# Patient Record
Sex: Male | Born: 1975 | Race: Black or African American | Hispanic: No | Marital: Single | Smoking: Never smoker
Health system: Southern US, Community
[De-identification: ages and names within clinical notes are randomized; demographics above are authoritative.]

## PROBLEM LIST (undated history)

## (undated) DIAGNOSIS — I10 Essential (primary) hypertension: Secondary | ICD-10-CM

## (undated) DIAGNOSIS — G35 Multiple sclerosis: Secondary | ICD-10-CM

## (undated) HISTORY — DX: Multiple sclerosis: G35

## (undated) HISTORY — DX: Essential (primary) hypertension: I10

---

## 2013-01-08 DIAGNOSIS — R2681 Unsteadiness on feet: Secondary | ICD-10-CM | POA: Insufficient documentation

## 2015-02-16 ENCOUNTER — Ambulatory Visit: Payer: Medicare Other

## 2015-02-16 ENCOUNTER — Ambulatory Visit: Payer: Medicare Other | Admitting: Family Medicine

## 2015-02-20 ENCOUNTER — Inpatient Hospital Stay: Payer: Medicare Other

## 2015-02-20 ENCOUNTER — Encounter: Payer: Self-pay | Admitting: Oncology

## 2015-02-20 ENCOUNTER — Inpatient Hospital Stay: Payer: Medicare Other | Attending: Oncology | Admitting: Oncology

## 2015-02-20 VITALS — BP 102/67 | HR 74 | Temp 98.0°F | Resp 18

## 2015-02-20 VITALS — BP 108/71 | HR 92 | Temp 98.1°F | Resp 18 | Ht 72.44 in | Wt 194.2 lb

## 2015-02-20 DIAGNOSIS — G35 Multiple sclerosis: Secondary | ICD-10-CM | POA: Diagnosis not present

## 2015-02-20 DIAGNOSIS — Z79899 Other long term (current) drug therapy: Secondary | ICD-10-CM | POA: Insufficient documentation

## 2015-02-20 DIAGNOSIS — I10 Essential (primary) hypertension: Secondary | ICD-10-CM | POA: Insufficient documentation

## 2015-02-20 MED ORDER — SODIUM CHLORIDE 0.9 % IV SOLN
Freq: Once | INTRAVENOUS | Status: AC
  Administered 2015-02-20: 10:00:00 via INTRAVENOUS
  Filled 2015-02-20: qty 1000

## 2015-02-20 MED ORDER — ACETAMINOPHEN 500 MG PO TABS
1000.0000 mg | ORAL_TABLET | Freq: Once | ORAL | Status: AC
  Administered 2015-02-20: 1000 mg via ORAL
  Filled 2015-02-20: qty 2

## 2015-02-20 MED ORDER — SODIUM CHLORIDE 0.9 % IV SOLN
300.0000 mg | Freq: Once | INTRAVENOUS | Status: AC
  Administered 2015-02-20: 300 mg via INTRAVENOUS
  Filled 2015-02-20: qty 15

## 2015-02-20 NOTE — Progress Notes (Signed)
[facility]  Telephone:(336) [phone] Fax:(336) [phone]  ID: Janalee Dane OB: Jul 01, 1976  MR#: [id number]  AOZ#:[id number]  No care team member to display  CHIEF COMPLAINT:  Chief Complaint  Patient presents with  . New Evaluation    Tysabri infusions for MS    INTERVAL HISTORY: Patient is a 39 year old male with a diagnosis of multiple sclerosis referred to receive Tysabri infusions. He has been receiving these infusions for over a year without significant side effects or reaction. He reports his JC virus is negative. He currently feels well and is asymptomatic. He denies any recent fevers or illnesses. He has no new neurologic complaints. He has a good appetite and denies weight loss. Patient feels at his baseline and offers no specific complaints today.  REVIEW OF SYSTEMS:   Review of Systems  Constitutional: Negative.   Respiratory: Negative.   Cardiovascular: Negative.     As per HPI. Otherwise, a complete review of systems is negatve.  PAST MEDICAL HISTORY: Multiple sclerosis, hypertension.  PAST SURGICAL HISTORY: No past surgical history on file.  FAMILY HISTORY: Reviewed and unchanged. No report of chronic disease.      ADVANCED DIRECTIVES:    HEALTH MAINTENANCE: History  Substance Use Topics  . Smoking status: Not on file  . Smokeless tobacco: Not on file  . Alcohol Use: Not on file     Colonoscopy:  PAP:  Bone density:  Lipid panel:  No Known Allergies  Current Outpatient Prescriptions  Medication Sig Dispense Refill  . dalfampridine (AMPYRA) 10 MG TB12 Take 10 mg by mouth 2 (two) times daily.    . ferrous fumarate (HEMOCYTE - 106 MG FE) 325 (106 FE) MG TABS tablet Take 1 tablet by mouth.    Marland Kitchen lisinopril-hydrochlorothiazide (PRINZIDE,ZESTORETIC) 20-25 MG per tablet Take 1 tablet by mouth daily.    . Multiple Vitamin (MULTIVITAMIN) tablet Take 1 tablet by mouth daily.    . Omega-3 Fatty Acids (FISH OIL) 1000 MG CAPS Take 1  capsule by mouth daily.     No current facility-administered medications for this visit.    OBJECTIVE: There were no vitals filed for this visit.   There is no height or weight on file to calculate BMI.    ECOG FS:0 - Asymptomatic  General: Well-developed, well-nourished, no acute distress. Eyes: Pink conjunctiva, anicteric sclera. HEENT: Normocephalic, moist mucous membranes, clear oropharnyx. Lungs: Clear to auscultation bilaterally. Heart: Regular rate and rhythm. No rubs, murmurs, or gallops. Abdomen: Soft, nontender, nondistended. No organomegaly noted, normoactive bowel sounds. Musculoskeletal: No edema, cyanosis, or clubbing. Neuro: Alert, answering all questions appropriately. Cranial nerves grossly intact. Skin: No rashes or petechiae noted. Psych: Normal affect. Lymphatics: No cervical, calvicular, axillary or inguinal LAD.   LAB RESULTS:  No results found for: NA, K, CL, CO2, GLUCOSE, BUN, CREATININE, CALCIUM, PROT, ALBUMIN, AST, ALT, ALKPHOS, BILITOT, GFRNONAA, GFRAA  No results found for: WBC, NEUTROABS, HGB, HCT, MCV, PLT   STUDIES: No results found.  ASSESSMENT:  Tysabri infusion for multiple sclerosis.   PLAN:    1. Multiple sclerosis: Continue with 300 mg to Tysabri every 4 weeks as ordered by patient's primary neurologist. Patient expressed understanding that all laboratory work including his JC virus titers, will be checked by his Neurologist. He also understands that any questions or concerns regarding Tysabri or his multiple sclerosis should be directed towards his Neurologist. Return to clinic in 6 months for routine evaluation.  Patient expressed understanding and was in agreement with this plan. He also  understands that He can call clinic at any time with any questions, concerns, or complaints.   Neurologist at [facility]:  Satira Sark, MD  Jeralyn Ruths, MD   02/20/2015 9:48 AM

## 2015-03-20 ENCOUNTER — Inpatient Hospital Stay: Payer: Medicare Other | Attending: Oncology

## 2015-03-20 VITALS — BP 108/69 | HR 79 | Temp 98.8°F | Resp 18

## 2015-03-20 DIAGNOSIS — G35 Multiple sclerosis: Secondary | ICD-10-CM | POA: Diagnosis present

## 2015-03-20 DIAGNOSIS — Z79899 Other long term (current) drug therapy: Secondary | ICD-10-CM | POA: Insufficient documentation

## 2015-03-20 MED ORDER — SODIUM CHLORIDE 0.9 % IJ SOLN
3.0000 mL | Freq: Once | INTRAMUSCULAR | Status: DC
  Filled 2015-03-20: qty 10

## 2015-03-20 MED ORDER — SODIUM CHLORIDE 0.9 % IV SOLN
Freq: Once | INTRAVENOUS | Status: AC
  Administered 2015-03-20: 11:00:00 via INTRAVENOUS
  Filled 2015-03-20: qty 1000

## 2015-03-20 MED ORDER — SODIUM CHLORIDE 0.9 % IV SOLN
300.0000 mg | Freq: Once | INTRAVENOUS | Status: AC
  Administered 2015-03-20: 300 mg via INTRAVENOUS
  Filled 2015-03-20: qty 15

## 2015-03-20 MED ORDER — ACETAMINOPHEN 500 MG PO TABS
1000.0000 mg | ORAL_TABLET | Freq: Once | ORAL | Status: AC
  Administered 2015-03-20: 1000 mg via ORAL
  Filled 2015-03-20: qty 2

## 2015-03-20 MED ORDER — SODIUM CHLORIDE 0.9 % IJ SOLN
10.0000 mL | Freq: Once | INTRAMUSCULAR | Status: DC
  Filled 2015-03-20: qty 10

## 2015-03-25 DIAGNOSIS — I10 Essential (primary) hypertension: Secondary | ICD-10-CM | POA: Insufficient documentation

## 2015-04-17 ENCOUNTER — Inpatient Hospital Stay: Payer: Medicare Other | Attending: Oncology

## 2015-04-17 ENCOUNTER — Telehealth: Payer: Self-pay | Admitting: Pharmacist

## 2015-04-17 VITALS — BP 107/68 | HR 78 | Temp 96.8°F | Resp 20

## 2015-04-17 DIAGNOSIS — G35 Multiple sclerosis: Secondary | ICD-10-CM | POA: Diagnosis present

## 2015-04-17 DIAGNOSIS — Z79899 Other long term (current) drug therapy: Secondary | ICD-10-CM | POA: Insufficient documentation

## 2015-04-17 MED ORDER — SODIUM CHLORIDE 0.9 % IV SOLN
Freq: Once | INTRAVENOUS | Status: AC
  Administered 2015-04-17: 10:00:00 via INTRAVENOUS
  Filled 2015-04-17: qty 1000

## 2015-04-17 MED ORDER — ACETAMINOPHEN 500 MG PO TABS
1000.0000 mg | ORAL_TABLET | Freq: Once | ORAL | Status: AC
Start: 2015-04-17 — End: 2015-04-17
  Administered 2015-04-17: 1000 mg via ORAL
  Filled 2015-04-17: qty 2

## 2015-04-17 MED ORDER — SODIUM CHLORIDE 0.9 % IV SOLN
300.0000 mg | Freq: Once | INTRAVENOUS | Status: AC
  Administered 2015-04-17: 300 mg via INTRAVENOUS
  Filled 2015-04-17: qty 15

## 2015-04-17 NOTE — Telephone Encounter (Signed)
CalledDr. [name]'s office at [facility] [phone]) to request a new refill. I also had patient reach out to them. They will not send prescriptions in a timely fashion. Told them we need a written prescription with refills. Still no response from 9:00 phone call by me. Sending fax requesting information ASAP.

## 2015-04-24 ENCOUNTER — Telehealth: Payer: Self-pay | Admitting: Pharmacist

## 2015-04-24 NOTE — Telephone Encounter (Signed)
Left message with infusion at [facility] [phone]). We still have not received a prescription for patient's Tysabri infusion from 04/17/15. We did receive an infusion order dated 05/02/15 for his October infusion. Gave them fax# for [place].

## 2015-05-15 ENCOUNTER — Inpatient Hospital Stay: Payer: Medicare Other | Attending: Oncology

## 2015-05-15 VITALS — BP 109/79 | HR 80 | Temp 97.7°F | Resp 18

## 2015-05-15 DIAGNOSIS — Z79899 Other long term (current) drug therapy: Secondary | ICD-10-CM | POA: Diagnosis not present

## 2015-05-15 DIAGNOSIS — G35 Multiple sclerosis: Secondary | ICD-10-CM | POA: Diagnosis present

## 2015-05-15 MED ORDER — SODIUM CHLORIDE 0.9 % IV SOLN
Freq: Once | INTRAVENOUS | Status: AC
  Administered 2015-05-15: 11:00:00 via INTRAVENOUS
  Filled 2015-05-15: qty 1000

## 2015-05-15 MED ORDER — SODIUM CHLORIDE 0.9 % IV SOLN
300.0000 mg | Freq: Once | INTRAVENOUS | Status: AC
  Administered 2015-05-15: 300 mg via INTRAVENOUS
  Filled 2015-05-15: qty 15

## 2015-05-15 MED ORDER — ACETAMINOPHEN 500 MG PO TABS
1000.0000 mg | ORAL_TABLET | Freq: Once | ORAL | Status: AC
  Administered 2015-05-15: 1000 mg via ORAL
  Filled 2015-05-15: qty 2

## 2015-06-11 ENCOUNTER — Telehealth: Payer: Self-pay | Admitting: Pharmacist

## 2015-06-11 NOTE — Telephone Encounter (Signed)
Called for Tysabri refill on 06/10/15 and again today 06/11/15. [phone])  Will call patient and let them know that if we dont have refill prescription by this afternoon his appointment will have to be rescheduled

## 2015-06-12 ENCOUNTER — Inpatient Hospital Stay: Payer: Medicare Other | Attending: Oncology

## 2015-06-12 VITALS — BP 112/73 | HR 86 | Temp 97.5°F | Resp 18

## 2015-06-12 DIAGNOSIS — Z79899 Other long term (current) drug therapy: Secondary | ICD-10-CM | POA: Insufficient documentation

## 2015-06-12 DIAGNOSIS — G35 Multiple sclerosis: Secondary | ICD-10-CM | POA: Diagnosis not present

## 2015-06-12 MED ORDER — SODIUM CHLORIDE 0.9 % IV SOLN
Freq: Once | INTRAVENOUS | Status: AC
  Administered 2015-06-12: 10:00:00 via INTRAVENOUS
  Filled 2015-06-12: qty 1000

## 2015-06-12 MED ORDER — ACETAMINOPHEN 500 MG PO TABS
1000.0000 mg | ORAL_TABLET | Freq: Once | ORAL | Status: AC
  Administered 2015-06-12: 1000 mg via ORAL
  Filled 2015-06-12: qty 2

## 2015-06-12 MED ORDER — SODIUM CHLORIDE 0.9 % IV SOLN
300.0000 mg | Freq: Once | INTRAVENOUS | Status: AC
  Administered 2015-06-12: 300 mg via INTRAVENOUS
  Filled 2015-06-12: qty 15

## 2015-07-10 ENCOUNTER — Inpatient Hospital Stay: Payer: Medicare Other | Attending: Oncology

## 2015-07-10 VITALS — BP 105/69 | HR 84 | Temp 97.2°F | Resp 18

## 2015-07-10 DIAGNOSIS — G35 Multiple sclerosis: Secondary | ICD-10-CM | POA: Diagnosis present

## 2015-07-10 DIAGNOSIS — Z79899 Other long term (current) drug therapy: Secondary | ICD-10-CM | POA: Insufficient documentation

## 2015-07-10 MED ORDER — ACETAMINOPHEN 500 MG PO TABS
1000.0000 mg | ORAL_TABLET | Freq: Once | ORAL | Status: AC
Start: 2015-07-10 — End: 2015-07-10
  Administered 2015-07-10: 1000 mg via ORAL
  Filled 2015-07-10: qty 2

## 2015-07-10 MED ORDER — SODIUM CHLORIDE 0.9 % IV SOLN
300.0000 mg | Freq: Once | INTRAVENOUS | Status: AC
  Administered 2015-07-10: 300 mg via INTRAVENOUS
  Filled 2015-07-10: qty 15

## 2015-07-10 MED ORDER — SODIUM CHLORIDE 0.9 % IV SOLN
Freq: Once | INTRAVENOUS | Status: AC
  Administered 2015-07-10: 10:00:00 via INTRAVENOUS
  Filled 2015-07-10: qty 1000

## 2015-08-06 ENCOUNTER — Other Ambulatory Visit: Payer: Self-pay | Admitting: *Deleted

## 2015-08-06 DIAGNOSIS — G35 Multiple sclerosis: Secondary | ICD-10-CM

## 2015-08-07 ENCOUNTER — Inpatient Hospital Stay: Payer: Medicare Other

## 2015-08-07 ENCOUNTER — Encounter: Payer: Self-pay | Admitting: *Deleted

## 2015-08-07 ENCOUNTER — Other Ambulatory Visit: Payer: Self-pay | Admitting: *Deleted

## 2015-08-07 ENCOUNTER — Inpatient Hospital Stay: Payer: Medicare Other | Attending: Oncology

## 2015-08-07 VITALS — BP 117/69 | HR 88 | Temp 98.0°F | Resp 18

## 2015-08-07 DIAGNOSIS — G35 Multiple sclerosis: Secondary | ICD-10-CM | POA: Diagnosis not present

## 2015-08-07 DIAGNOSIS — Z79899 Other long term (current) drug therapy: Secondary | ICD-10-CM | POA: Diagnosis not present

## 2015-08-07 LAB — COMPREHENSIVE METABOLIC PANEL
ALK PHOS: 102 U/L (ref 38–126)
ALT: 22 U/L (ref 17–63)
AST: 19 U/L (ref 15–41)
Albumin: 4.2 g/dL (ref 3.5–5.0)
Anion gap: 6 (ref 5–15)
BILIRUBIN TOTAL: 0.7 mg/dL (ref 0.3–1.2)
BUN: 20 mg/dL (ref 6–20)
CALCIUM: 9.2 mg/dL (ref 8.9–10.3)
CO2: 28 mmol/L (ref 22–32)
CREATININE: 0.98 mg/dL (ref 0.61–1.24)
Chloride: 100 mmol/L — ABNORMAL LOW (ref 101–111)
GFR calc non Af Amer: >60 mL/min (ref >60)
GLUCOSE: 113 mg/dL — AB (ref 65–99)
Potassium: 3.7 mmol/L (ref 3.5–5.1)
SODIUM: 134 mmol/L — AB (ref 135–145)
TOTAL PROTEIN: 7.6 g/dL (ref 6.5–8.1)

## 2015-08-07 LAB — CBC WITH DIFFERENTIAL/PLATELET
Basophils Absolute: 0 10*3/uL (ref 0–0.1)
Basophils Relative: 0 %
EOS ABS: 0.1 10*3/uL (ref 0–0.7)
Eosinophils Relative: 2 %
HEMATOCRIT: 41.4 % (ref 40.0–52.0)
HEMOGLOBIN: 14.1 g/dL (ref 13.0–18.0)
LYMPHS ABS: 3 10*3/uL (ref 1.0–3.6)
LYMPHS PCT: 39 %
MCH: 32.9 pg (ref 26.0–34.0)
MCHC: 34.1 g/dL (ref 32.0–36.0)
MCV: 96.5 fL (ref 80.0–100.0)
MONOS PCT: 12 %
Monocytes Absolute: 0.9 10*3/uL (ref 0.2–1.0)
NEUTROS ABS: 3.7 10*3/uL (ref 1.4–6.5)
NEUTROS PCT: 47 %
Platelets: 250 10*3/uL (ref 150–440)
RBC: 4.29 MIL/uL — AB (ref 4.40–5.90)
RDW: 13.2 % (ref 11.5–14.5)
WBC: 7.7 10*3/uL (ref 3.8–10.6)

## 2015-08-07 MED ORDER — SODIUM CHLORIDE 0.9 % IV SOLN
Freq: Once | INTRAVENOUS | Status: AC
Start: 2015-08-07 — End: 2015-08-07
  Administered 2015-08-07: 10:00:00 via INTRAVENOUS
  Filled 2015-08-07: qty 1000

## 2015-08-07 MED ORDER — ACETAMINOPHEN 500 MG PO TABS
1000.0000 mg | ORAL_TABLET | Freq: Once | ORAL | Status: AC
  Administered 2015-08-07: 1000 mg via ORAL
  Filled 2015-08-07: qty 2

## 2015-08-07 MED ORDER — SODIUM CHLORIDE 0.9 % IV SOLN
300.0000 mg | Freq: Once | INTRAVENOUS | Status: AC
  Administered 2015-08-07: 300 mg via INTRAVENOUS
  Filled 2015-08-07: qty 15

## 2015-08-07 NOTE — Progress Notes (Signed)
Per orders from Dr. Satira Sark ([facility] in [place];Neurology) patient to have cbc and metc drawn prior to each Tysabri infusion. Please fax results to [phone].

## 2015-09-04 ENCOUNTER — Inpatient Hospital Stay (HOSPITAL_BASED_OUTPATIENT_CLINIC_OR_DEPARTMENT_OTHER): Payer: Medicare Other | Admitting: Oncology

## 2015-09-04 ENCOUNTER — Inpatient Hospital Stay: Payer: Medicare Other | Attending: Oncology

## 2015-09-04 ENCOUNTER — Inpatient Hospital Stay: Payer: Medicare Other

## 2015-09-04 VITALS — BP 116/84 | HR 78 | Resp 20

## 2015-09-04 DIAGNOSIS — Z79899 Other long term (current) drug therapy: Secondary | ICD-10-CM | POA: Diagnosis not present

## 2015-09-04 DIAGNOSIS — I10 Essential (primary) hypertension: Secondary | ICD-10-CM | POA: Insufficient documentation

## 2015-09-04 DIAGNOSIS — G35 Multiple sclerosis: Secondary | ICD-10-CM | POA: Insufficient documentation

## 2015-09-04 LAB — CBC WITH DIFFERENTIAL/PLATELET
BASOS ABS: 0.1 10*3/uL (ref 0–0.1)
BASOS PCT: 1 %
EOS ABS: 0.1 10*3/uL (ref 0–0.7)
Eosinophils Relative: 2 %
HCT: 42.6 % (ref 40.0–52.0)
HEMOGLOBIN: 14.5 g/dL (ref 13.0–18.0)
Lymphocytes Relative: 38 %
Lymphs Abs: 2.5 10*3/uL (ref 1.0–3.6)
MCH: 32.6 pg (ref 26.0–34.0)
MCHC: 34.2 g/dL (ref 32.0–36.0)
MCV: 95.3 fL (ref 80.0–100.0)
MONOS PCT: 13 %
Monocytes Absolute: 0.8 10*3/uL (ref 0.2–1.0)
NEUTROS PCT: 46 %
Neutro Abs: 3.1 10*3/uL (ref 1.4–6.5)
Platelets: 240 10*3/uL (ref 150–440)
RBC: 4.47 MIL/uL (ref 4.40–5.90)
RDW: 13.3 % (ref 11.5–14.5)
WBC: 6.7 10*3/uL (ref 3.8–10.6)

## 2015-09-04 LAB — COMPREHENSIVE METABOLIC PANEL
ALBUMIN: 4.5 g/dL (ref 3.5–5.0)
ALK PHOS: 97 U/L (ref 38–126)
ALT: 22 U/L (ref 17–63)
AST: 19 U/L (ref 15–41)
Anion gap: 10 (ref 5–15)
BILIRUBIN TOTAL: 0.9 mg/dL (ref 0.3–1.2)
BUN: 18 mg/dL (ref 6–20)
CALCIUM: 9.6 mg/dL (ref 8.9–10.3)
CO2: 26 mmol/L (ref 22–32)
CREATININE: 0.93 mg/dL (ref 0.61–1.24)
Chloride: 99 mmol/L — ABNORMAL LOW (ref 101–111)
GFR calc Af Amer: >60 mL/min (ref >60)
GFR calc non Af Amer: >60 mL/min (ref >60)
Glucose, Bld: 118 mg/dL — ABNORMAL HIGH (ref 65–99)
Potassium: 3.9 mmol/L (ref 3.5–5.1)
Sodium: 135 mmol/L (ref 135–145)
TOTAL PROTEIN: 8 g/dL (ref 6.5–8.1)

## 2015-09-04 MED ORDER — SODIUM CHLORIDE 0.9 % IJ SOLN
10.0000 mL | Freq: Once | INTRAMUSCULAR | Status: DC
  Filled 2015-09-04: qty 10

## 2015-09-04 MED ORDER — ALTEPLASE 2 MG IJ SOLR
2.0000 mg | Freq: Once | INTRAMUSCULAR | Status: DC
  Filled 2015-09-04: qty 2

## 2015-09-04 MED ORDER — HEPARIN SOD (PORK) LOCK FLUSH 100 UNIT/ML IV SOLN: 250.0000 [IU] | Freq: Once | INTRAVENOUS | Status: DC

## 2015-09-04 MED ORDER — ACETAMINOPHEN 500 MG PO TABS
1000.0000 mg | ORAL_TABLET | Freq: Once | ORAL | Status: AC
  Administered 2015-09-04: 1000 mg via ORAL
  Filled 2015-09-04: qty 2

## 2015-09-04 MED ORDER — HEPARIN SOD (PORK) LOCK FLUSH 100 UNIT/ML IV SOLN: 500.0000 [IU] | Freq: Once | INTRAVENOUS | Status: DC

## 2015-09-04 MED ORDER — SODIUM CHLORIDE 0.9 % IV SOLN
Freq: Once | INTRAVENOUS | Status: AC
  Administered 2015-09-04: 11:00:00 via INTRAVENOUS
  Filled 2015-09-04: qty 1000

## 2015-09-04 MED ORDER — NATALIZUMAB 300 MG/15ML IV CONC
300.0000 mg | Freq: Once | INTRAVENOUS | Status: AC
  Administered 2015-09-04: 300 mg via INTRAVENOUS
  Filled 2015-09-04: qty 15

## 2015-09-04 MED ORDER — SODIUM CHLORIDE 0.9 % IJ SOLN
3.0000 mL | Freq: Once | INTRAMUSCULAR | Status: DC
Start: 2015-09-04 — End: 2015-09-04
  Filled 2015-09-04: qty 10

## 2015-09-04 NOTE — Progress Notes (Signed)
[facility]  Telephone:(336) [phone] Fax:(336) [phone]  ID: Jerry Bautista OB: [birth year]  MR#: [id number]  NUU#:[id number]  Dr. Meredeth Ide with [facility], [place], [place]  CHIEF COMPLAINT:  MS  INTERVAL HISTORY: Patient is a 40 year old male with a diagnosis of multiple sclerosis referred to receive Tysabri infusions. He has been receiving these infusions for over a year without significant side effects or reaction. He reports his JC virus is negative. He currently feels well and is asymptomatic. He denies any recent fevers or illnesses. He has no new neurologic complaints. He has a good appetite and denies weight loss. Patient was evaluated by Dr. Meredeth Ide approximately 1-2 months ago. Patient feels at his baseline and offers no specific complaints today.  REVIEW OF SYSTEMS:   Review of Systems  Constitutional: Negative.  Negative for fever, chills, weight loss, malaise/fatigue and diaphoresis.  HENT: Negative.   Eyes: Negative.   Respiratory: Negative.  Negative for cough, hemoptysis, sputum production, shortness of breath and wheezing.   Cardiovascular: Negative.  Negative for chest pain, palpitations, orthopnea, claudication, leg swelling and PND.  Gastrointestinal: Negative for heartburn, nausea, vomiting, abdominal pain, diarrhea, constipation, blood in stool and melena.  Genitourinary: Negative.   Musculoskeletal: Negative.   Skin: Negative.   Neurological: Negative for dizziness, tingling, focal weakness, seizures and weakness.  Endo/Heme/Allergies: Does not bruise/bleed easily.  Psychiatric/Behavioral: Negative for depression. The patient is not nervous/anxious and does not have insomnia.     As per HPI. Otherwise, a complete review of systems is negatve.  PAST MEDICAL HISTORY: Multiple sclerosis, hypertension.  PAST SURGICAL HISTORY: No past surgical history on file.  FAMILY HISTORY: Reviewed and unchanged. No report of chronic disease.       ADVANCED DIRECTIVES:    HEALTH MAINTENANCE: Social History  Substance Use Topics  . Smoking status: Not on file  . Smokeless tobacco: Not on file  . Alcohol Use: Not on file     Colonoscopy:  PAP:  Bone density:  Lipid panel:  No Known Allergies  Current Outpatient Prescriptions  Medication Sig Dispense Refill  . dalfampridine (AMPYRA) 10 MG TB12 Take 10 mg by mouth 2 (two) times daily.    . ferrous fumarate (HEMOCYTE - 106 MG FE) 325 (106 FE) MG TABS tablet Take 1 tablet by mouth.    Marland Kitchen lisinopril-hydrochlorothiazide (PRINZIDE,ZESTORETIC) 20-25 MG per tablet Take 1 tablet by mouth daily.    . Multiple Vitamin (MULTIVITAMIN) tablet Take 1 tablet by mouth daily.    . Omega-3 Fatty Acids (FISH OIL) 1000 MG CAPS Take 1 capsule by mouth daily.     No current facility-administered medications for this visit.   Facility-Administered Medications Ordered in Other Visits  Medication Dose Route Frequency Provider Last Rate Last Dose  . alteplase (CATHFLO ACTIVASE) injection 2 mg  2 mg Intracatheter Once Jeralyn Ruths, MD   2 mg at 09/04/15 1055  . heparin lock flush 100 unit/mL  500 Units Intracatheter Once Jeralyn Ruths, MD   500 Units at 09/04/15 1055  . heparin lock flush 100 unit/mL  250 Units Intracatheter Once Jeralyn Ruths, MD   250 Units at 09/04/15 1055  . natalizumab (TYSABRI) 300 mg in sodium chloride 0.9 % 100 mL IVPB  300 mg Intravenous Once Jeralyn Ruths, MD 115 mL/hr at 09/04/15 1108 300 mg at 09/04/15 1108  . sodium chloride 0.9 % injection 10 mL  10 mL Intracatheter Once Jeralyn Ruths, MD   10 mL at 09/04/15 1054  .  sodium chloride 0.9 % injection 3 mL  3 mL Intracatheter Once Jeralyn Ruths, MD   3 mL at 09/04/15 1055    OBJECTIVE: ECOG FS:0 - Asymptomatic  General: Well-developed, well-nourished, no acute distress. Eyes: Pink conjunctiva, anicteric sclera. HEENT: Normocephalic, moist mucous membranes, clear oropharnyx. Lungs:  Clear to auscultation bilaterally. Heart: Regular rate and rhythm. No rubs, murmurs, or gallops. Abdomen: Soft, nontender, nondistended. No organomegaly noted, normoactive bowel sounds. Musculoskeletal: No edema, cyanosis, or clubbing. Neuro: Alert, answering all questions appropriately. Cranial nerves grossly intact. Skin: No rashes or petechiae noted. Psych: Normal affect.    LAB RESULTS:  Lab Results  Component Value Date   NA 134* 08/07/2015   K 3.7 08/07/2015   CL 100* 08/07/2015   CO2 28 08/07/2015   GLUCOSE 113* 08/07/2015   BUN 20 08/07/2015   CREATININE 0.98 08/07/2015   CALCIUM 9.2 08/07/2015   PROT 7.6 08/07/2015   ALBUMIN 4.2 08/07/2015   AST 19 08/07/2015   ALT 22 08/07/2015   ALKPHOS 102 08/07/2015   BILITOT 0.7 08/07/2015   GFRNONAA >60 08/07/2015   GFRAA >60 08/07/2015    Lab Results  Component Value Date   WBC 6.7 09/04/2015   NEUTROABS 3.1 09/04/2015   HGB 14.5 09/04/2015   HCT 42.6 09/04/2015   MCV 95.3 09/04/2015   PLT 240 09/04/2015     STUDIES: No results found.  ASSESSMENT:  Tysabri infusion for multiple sclerosis.   PLAN:    1. Multiple sclerosis: Continue with 300 mg to Tysabri every 4 weeks as ordered by patient's primary neurologist. Patient expressed understanding that all laboratory work including his JC virus titers, will be checked by his Neurologist.   He also understands that any questions or concerns regarding Tysabri or his multiple sclerosis should be directed towards his Neurologist. Return to clinic in 6 months for routine evaluation.  Patient expressed understanding and was in agreement with this plan. He also understands that He can call clinic at any time with any questions, concerns, or complaints.   Neurologist at [facility]:  Satira Sark, MD  Loann Quill, NP   09/04/2015 11:09 AM

## 2015-10-02 ENCOUNTER — Inpatient Hospital Stay: Payer: Medicare Other | Attending: Oncology

## 2015-10-02 ENCOUNTER — Inpatient Hospital Stay: Payer: Medicare Other

## 2015-10-02 VITALS — BP 110/73 | HR 78 | Temp 98.8°F | Resp 18

## 2015-10-02 DIAGNOSIS — G35 Multiple sclerosis: Secondary | ICD-10-CM

## 2015-10-02 DIAGNOSIS — Z79899 Other long term (current) drug therapy: Secondary | ICD-10-CM | POA: Diagnosis not present

## 2015-10-02 LAB — COMPREHENSIVE METABOLIC PANEL
ALBUMIN: 4.4 g/dL (ref 3.5–5.0)
ALK PHOS: 102 U/L (ref 38–126)
ALT: 23 U/L (ref 17–63)
ANION GAP: 6 (ref 5–15)
AST: 18 U/L (ref 15–41)
BUN: 20 mg/dL (ref 6–20)
CALCIUM: 9 mg/dL (ref 8.9–10.3)
CHLORIDE: 101 mmol/L (ref 101–111)
CO2: 26 mmol/L (ref 22–32)
Creatinine, Ser: 0.83 mg/dL (ref 0.61–1.24)
GFR calc non Af Amer: >60 mL/min (ref >60)
GLUCOSE: 112 mg/dL — AB (ref 65–99)
POTASSIUM: 4 mmol/L (ref 3.5–5.1)
SODIUM: 133 mmol/L — AB (ref 135–145)
Total Bilirubin: 0.7 mg/dL (ref 0.3–1.2)
Total Protein: 7.9 g/dL (ref 6.5–8.1)

## 2015-10-02 LAB — CBC WITH DIFFERENTIAL/PLATELET
BASOS PCT: 1 %
Basophils Absolute: 0.1 10*3/uL (ref 0–0.1)
Eosinophils Absolute: 0.2 10*3/uL (ref 0–0.7)
Eosinophils Relative: 2 %
HEMATOCRIT: 41.7 % (ref 40.0–52.0)
HEMOGLOBIN: 14.1 g/dL (ref 13.0–18.0)
LYMPHS ABS: 2.9 10*3/uL (ref 1.0–3.6)
LYMPHS PCT: 34 %
MCH: 32.3 pg (ref 26.0–34.0)
MCHC: 34 g/dL (ref 32.0–36.0)
MCV: 95.1 fL (ref 80.0–100.0)
MONO ABS: 1.3 10*3/uL — AB (ref 0.2–1.0)
MONOS PCT: 15 %
NEUTROS ABS: 4.1 10*3/uL (ref 1.4–6.5)
NEUTROS PCT: 48 %
Platelets: 230 10*3/uL (ref 150–440)
RBC: 4.38 MIL/uL — ABNORMAL LOW (ref 4.40–5.90)
RDW: 12.7 % (ref 11.5–14.5)
WBC: 8.5 10*3/uL (ref 3.8–10.6)

## 2015-10-02 MED ORDER — SODIUM CHLORIDE 0.9 % IV SOLN
Freq: Once | INTRAVENOUS | Status: AC
  Administered 2015-10-02: 10:00:00 via INTRAVENOUS
  Filled 2015-10-02: qty 1000

## 2015-10-02 MED ORDER — SODIUM CHLORIDE 0.9 % IV SOLN
300.0000 mg | Freq: Once | INTRAVENOUS | Status: AC
  Administered 2015-10-02: 300 mg via INTRAVENOUS
  Filled 2015-10-02: qty 15

## 2015-10-02 MED ORDER — ACETAMINOPHEN 500 MG PO TABS
1000.0000 mg | ORAL_TABLET | Freq: Once | ORAL | Status: AC
  Administered 2015-10-02: 1000 mg via ORAL
  Filled 2015-10-02: qty 2

## 2015-10-30 ENCOUNTER — Inpatient Hospital Stay: Payer: Medicare Other

## 2015-10-30 VITALS — BP 107/75 | HR 76 | Temp 96.9°F

## 2015-10-30 DIAGNOSIS — G35 Multiple sclerosis: Secondary | ICD-10-CM

## 2015-10-30 LAB — COMPREHENSIVE METABOLIC PANEL
ALBUMIN: 4.3 g/dL (ref 3.5–5.0)
ALT: 31 U/L (ref 17–63)
AST: 24 U/L (ref 15–41)
Alkaline Phosphatase: 116 U/L (ref 38–126)
Anion gap: 4 — ABNORMAL LOW (ref 5–15)
BUN: 21 mg/dL — AB (ref 6–20)
CO2: 26 mmol/L (ref 22–32)
Calcium: 9.2 mg/dL (ref 8.9–10.3)
Chloride: 104 mmol/L (ref 101–111)
Creatinine, Ser: 0.95 mg/dL (ref 0.61–1.24)
GFR calc Af Amer: >60 mL/min (ref >60)
Glucose, Bld: 114 mg/dL — ABNORMAL HIGH (ref 65–99)
POTASSIUM: 4.2 mmol/L (ref 3.5–5.1)
Sodium: 134 mmol/L — ABNORMAL LOW (ref 135–145)
Total Bilirubin: 0.7 mg/dL (ref 0.3–1.2)
Total Protein: 8.1 g/dL (ref 6.5–8.1)

## 2015-10-30 LAB — CBC WITH DIFFERENTIAL/PLATELET
Basophils Absolute: 0.1 10*3/uL (ref 0–0.1)
Basophils Relative: 1 %
EOS PCT: 2 %
Eosinophils Absolute: 0.1 10*3/uL (ref 0–0.7)
HCT: 41.6 % (ref 40.0–52.0)
HEMOGLOBIN: 14.2 g/dL (ref 13.0–18.0)
LYMPHS ABS: 2.9 10*3/uL (ref 1.0–3.6)
LYMPHS PCT: 33 %
MCH: 32.3 pg (ref 26.0–34.0)
MCHC: 34.2 g/dL (ref 32.0–36.0)
MCV: 94.6 fL (ref 80.0–100.0)
Monocytes Absolute: 1.2 10*3/uL — ABNORMAL HIGH (ref 0.2–1.0)
Monocytes Relative: 14 %
Neutro Abs: 4.4 10*3/uL (ref 1.4–6.5)
Neutrophils Relative %: 50 %
PLATELETS: 286 10*3/uL (ref 150–440)
RBC: 4.39 MIL/uL — AB (ref 4.40–5.90)
RDW: 13.4 % (ref 11.5–14.5)
WBC: 8.8 10*3/uL (ref 3.8–10.6)

## 2015-10-30 MED ORDER — ACETAMINOPHEN 500 MG PO TABS
1000.0000 mg | ORAL_TABLET | Freq: Once | ORAL | Status: AC
  Administered 2015-10-30: 1000 mg via ORAL
  Filled 2015-10-30: qty 2

## 2015-10-30 MED ORDER — SODIUM CHLORIDE 0.9 % IV SOLN
Freq: Once | INTRAVENOUS | Status: AC
  Administered 2015-10-30: 11:00:00 via INTRAVENOUS
  Filled 2015-10-30: qty 1000

## 2015-10-30 MED ORDER — SODIUM CHLORIDE 0.9 % IV SOLN
300.0000 mg | Freq: Once | INTRAVENOUS | Status: AC
  Administered 2015-10-30: 300 mg via INTRAVENOUS
  Filled 2015-10-30: qty 15

## 2015-11-27 ENCOUNTER — Inpatient Hospital Stay: Payer: Medicare Other | Attending: Oncology

## 2015-11-27 ENCOUNTER — Inpatient Hospital Stay: Payer: Medicare Other

## 2015-11-27 VITALS — BP 103/77 | HR 77 | Temp 96.3°F | Resp 20

## 2015-11-27 DIAGNOSIS — Z79899 Other long term (current) drug therapy: Secondary | ICD-10-CM | POA: Diagnosis not present

## 2015-11-27 DIAGNOSIS — G35 Multiple sclerosis: Secondary | ICD-10-CM

## 2015-11-27 LAB — CBC WITH DIFFERENTIAL/PLATELET
BASOS ABS: 0.1 10*3/uL (ref 0–0.1)
BASOS PCT: 2 %
EOS ABS: 0.1 10*3/uL (ref 0–0.7)
EOS PCT: 1 %
HEMATOCRIT: 41.3 % (ref 40.0–52.0)
Hemoglobin: 14.2 g/dL (ref 13.0–18.0)
Lymphocytes Relative: 34 %
Lymphs Abs: 2.8 10*3/uL (ref 1.0–3.6)
MCH: 32.2 pg (ref 26.0–34.0)
MCHC: 34.3 g/dL (ref 32.0–36.0)
MCV: 94 fL (ref 80.0–100.0)
MONO ABS: 1 10*3/uL (ref 0.2–1.0)
Monocytes Relative: 12 %
Neutro Abs: 4.2 10*3/uL (ref 1.4–6.5)
Neutrophils Relative %: 51 %
Platelets: 239 10*3/uL (ref 150–440)
RBC: 4.4 MIL/uL (ref 4.40–5.90)
RDW: 13 % (ref 11.5–14.5)
WBC: 8.3 10*3/uL (ref 3.8–10.6)

## 2015-11-27 LAB — COMPREHENSIVE METABOLIC PANEL
ALK PHOS: 100 U/L (ref 38–126)
ALT: 21 U/L (ref 17–63)
ANION GAP: 4 — AB (ref 5–15)
AST: 19 U/L (ref 15–41)
Albumin: 4.2 g/dL (ref 3.5–5.0)
BILIRUBIN TOTAL: 0.8 mg/dL (ref 0.3–1.2)
BUN: 24 mg/dL — ABNORMAL HIGH (ref 6–20)
CALCIUM: 9.3 mg/dL (ref 8.9–10.3)
CO2: 26 mmol/L (ref 22–32)
CREATININE: 0.87 mg/dL (ref 0.61–1.24)
Chloride: 105 mmol/L (ref 101–111)
Glucose, Bld: 128 mg/dL — ABNORMAL HIGH (ref 65–99)
Potassium: 3.9 mmol/L (ref 3.5–5.1)
Sodium: 135 mmol/L (ref 135–145)
TOTAL PROTEIN: 7.7 g/dL (ref 6.5–8.1)

## 2015-11-27 MED ORDER — ACETAMINOPHEN 500 MG PO TABS
1000.0000 mg | ORAL_TABLET | Freq: Once | ORAL | Status: AC
  Administered 2015-11-27: 1000 mg via ORAL
  Filled 2015-11-27: qty 2

## 2015-11-27 MED ORDER — SODIUM CHLORIDE 0.9 % IV SOLN
Freq: Once | INTRAVENOUS | Status: AC
  Administered 2015-11-27: 09:00:00 via INTRAVENOUS
  Filled 2015-11-27: qty 1000

## 2015-11-27 MED ORDER — NATALIZUMAB 300 MG/15ML IV CONC
300.0000 mg | Freq: Once | INTRAVENOUS | Status: AC
  Administered 2015-11-27: 300 mg via INTRAVENOUS
  Filled 2015-11-27: qty 15

## 2015-12-25 ENCOUNTER — Inpatient Hospital Stay: Payer: Medicare Other | Attending: Oncology

## 2015-12-25 ENCOUNTER — Inpatient Hospital Stay: Payer: Medicare Other

## 2015-12-25 VITALS — BP 113/79 | HR 69 | Resp 20

## 2015-12-25 DIAGNOSIS — Z79899 Other long term (current) drug therapy: Secondary | ICD-10-CM | POA: Diagnosis not present

## 2015-12-25 DIAGNOSIS — G35 Multiple sclerosis: Secondary | ICD-10-CM | POA: Insufficient documentation

## 2015-12-25 LAB — COMPREHENSIVE METABOLIC PANEL
ALK PHOS: 107 U/L (ref 38–126)
ALT: 29 U/L (ref 17–63)
AST: 21 U/L (ref 15–41)
Albumin: 4.2 g/dL (ref 3.5–5.0)
Anion gap: 4 — ABNORMAL LOW (ref 5–15)
BUN: 19 mg/dL (ref 6–20)
CALCIUM: 9.1 mg/dL (ref 8.9–10.3)
CO2: 28 mmol/L (ref 22–32)
CREATININE: 0.85 mg/dL (ref 0.61–1.24)
Chloride: 101 mmol/L (ref 101–111)
Glucose, Bld: 110 mg/dL — ABNORMAL HIGH (ref 65–99)
Potassium: 4 mmol/L (ref 3.5–5.1)
Sodium: 133 mmol/L — ABNORMAL LOW (ref 135–145)
TOTAL PROTEIN: 7.7 g/dL (ref 6.5–8.1)
Total Bilirubin: 1 mg/dL (ref 0.3–1.2)

## 2015-12-25 LAB — CBC WITH DIFFERENTIAL/PLATELET
BASOS ABS: 0.1 10*3/uL (ref 0–0.1)
BASOS PCT: 1 %
EOS ABS: 0.2 10*3/uL (ref 0–0.7)
Eosinophils Relative: 2 %
HCT: 41.4 % (ref 40.0–52.0)
HEMOGLOBIN: 14.1 g/dL (ref 13.0–18.0)
Lymphocytes Relative: 42 %
Lymphs Abs: 3.3 10*3/uL (ref 1.0–3.6)
MCH: 32.4 pg (ref 26.0–34.0)
MCHC: 34.1 g/dL (ref 32.0–36.0)
MCV: 94.8 fL (ref 80.0–100.0)
MONOS PCT: 13 %
Monocytes Absolute: 1 10*3/uL (ref 0.2–1.0)
NEUTROS ABS: 3.2 10*3/uL (ref 1.4–6.5)
NEUTROS PCT: 42 %
Platelets: 221 10*3/uL (ref 150–440)
RBC: 4.37 MIL/uL — ABNORMAL LOW (ref 4.40–5.90)
RDW: 13.5 % (ref 11.5–14.5)
WBC: 7.7 10*3/uL (ref 3.8–10.6)

## 2015-12-25 MED ORDER — SODIUM CHLORIDE 0.9 % IV SOLN
300.0000 mg | Freq: Once | INTRAVENOUS | Status: AC
  Administered 2015-12-25: 300 mg via INTRAVENOUS
  Filled 2015-12-25: qty 15

## 2015-12-25 MED ORDER — ACETAMINOPHEN 500 MG PO TABS
1000.0000 mg | ORAL_TABLET | Freq: Once | ORAL | Status: AC
  Administered 2015-12-25: 1000 mg via ORAL
  Filled 2015-12-25: qty 2

## 2015-12-25 MED ORDER — SODIUM CHLORIDE 0.9 % IV SOLN
Freq: Once | INTRAVENOUS | Status: AC
  Administered 2015-12-25: 10:00:00 via INTRAVENOUS
  Filled 2015-12-25: qty 1000

## 2016-01-22 ENCOUNTER — Inpatient Hospital Stay: Payer: Medicare Other | Attending: Oncology

## 2016-01-22 ENCOUNTER — Inpatient Hospital Stay: Payer: Medicare Other

## 2016-01-22 VITALS — BP 114/75 | HR 72 | Temp 96.8°F | Resp 20

## 2016-01-22 DIAGNOSIS — Z79899 Other long term (current) drug therapy: Secondary | ICD-10-CM | POA: Diagnosis not present

## 2016-01-22 DIAGNOSIS — G35 Multiple sclerosis: Secondary | ICD-10-CM | POA: Insufficient documentation

## 2016-01-22 LAB — CBC WITH DIFFERENTIAL/PLATELET
BASOS ABS: 0 10*3/uL (ref 0–0.1)
BASOS PCT: 0 %
EOS ABS: 0.1 10*3/uL (ref 0–0.7)
Eosinophils Relative: 2 %
HCT: 42.4 % (ref 40.0–52.0)
HEMOGLOBIN: 14.2 g/dL (ref 13.0–18.0)
LYMPHS ABS: 3.1 10*3/uL (ref 1.0–3.6)
Lymphocytes Relative: 40 %
MCH: 32.1 pg (ref 26.0–34.0)
MCHC: 33.6 g/dL (ref 32.0–36.0)
MCV: 95.5 fL (ref 80.0–100.0)
Monocytes Absolute: 0.9 10*3/uL (ref 0.2–1.0)
Monocytes Relative: 12 %
NEUTROS PCT: 46 %
Neutro Abs: 3.5 10*3/uL (ref 1.4–6.5)
Platelets: 231 10*3/uL (ref 150–440)
RBC: 4.43 MIL/uL (ref 4.40–5.90)
RDW: 13.3 % (ref 11.5–14.5)
WBC: 7.6 10*3/uL (ref 3.8–10.6)

## 2016-01-22 LAB — COMPREHENSIVE METABOLIC PANEL
ALT: 26 U/L (ref 17–63)
AST: 19 U/L (ref 15–41)
Albumin: 4 g/dL (ref 3.5–5.0)
Alkaline Phosphatase: 107 U/L (ref 38–126)
Anion gap: 4 — ABNORMAL LOW (ref 5–15)
BUN: 18 mg/dL (ref 6–20)
CALCIUM: 9.1 mg/dL (ref 8.9–10.3)
CHLORIDE: 103 mmol/L (ref 101–111)
CO2: 29 mmol/L (ref 22–32)
CREATININE: 0.86 mg/dL (ref 0.61–1.24)
Glucose, Bld: 119 mg/dL — ABNORMAL HIGH (ref 65–99)
Potassium: 4.1 mmol/L (ref 3.5–5.1)
Sodium: 136 mmol/L (ref 135–145)
Total Bilirubin: 0.6 mg/dL (ref 0.3–1.2)
Total Protein: 7.6 g/dL (ref 6.5–8.1)

## 2016-01-22 MED ORDER — SODIUM CHLORIDE 0.9 % IV SOLN
Freq: Once | INTRAVENOUS | Status: AC
  Administered 2016-01-22: 11:00:00 via INTRAVENOUS
  Filled 2016-01-22: qty 1000

## 2016-01-22 MED ORDER — ACETAMINOPHEN 500 MG PO TABS
1000.0000 mg | ORAL_TABLET | Freq: Once | ORAL | Status: AC
  Administered 2016-01-22: 1000 mg via ORAL
  Filled 2016-01-22: qty 2

## 2016-01-22 MED ORDER — SODIUM CHLORIDE 0.9 % IV SOLN
300.0000 mg | Freq: Once | INTRAVENOUS | Status: AC
  Administered 2016-01-22: 300 mg via INTRAVENOUS
  Filled 2016-01-22: qty 15

## 2016-02-19 ENCOUNTER — Inpatient Hospital Stay: Payer: Medicare Other

## 2016-02-19 ENCOUNTER — Inpatient Hospital Stay: Payer: Medicare Other | Attending: Oncology

## 2016-02-19 VITALS — BP 112/79 | HR 75 | Temp 97.4°F | Resp 18

## 2016-02-19 DIAGNOSIS — G35 Multiple sclerosis: Secondary | ICD-10-CM

## 2016-02-19 DIAGNOSIS — Z79899 Other long term (current) drug therapy: Secondary | ICD-10-CM | POA: Diagnosis not present

## 2016-02-19 LAB — COMPREHENSIVE METABOLIC PANEL
ALBUMIN: 4.1 g/dL (ref 3.5–5.0)
ALK PHOS: 97 U/L (ref 38–126)
ALT: 21 U/L (ref 17–63)
AST: 17 U/L (ref 15–41)
Anion gap: 7 (ref 5–15)
BILIRUBIN TOTAL: 0.9 mg/dL (ref 0.3–1.2)
BUN: 16 mg/dL (ref 6–20)
CALCIUM: 9.1 mg/dL (ref 8.9–10.3)
CO2: 26 mmol/L (ref 22–32)
Chloride: 100 mmol/L — ABNORMAL LOW (ref 101–111)
Creatinine, Ser: 0.98 mg/dL (ref 0.61–1.24)
GFR calc Af Amer: >60 mL/min (ref >60)
GFR calc non Af Amer: >60 mL/min (ref >60)
GLUCOSE: 96 mg/dL (ref 65–99)
POTASSIUM: 3.6 mmol/L (ref 3.5–5.1)
Sodium: 133 mmol/L — ABNORMAL LOW (ref 135–145)
TOTAL PROTEIN: 7.6 g/dL (ref 6.5–8.1)

## 2016-02-19 LAB — CBC WITH DIFFERENTIAL/PLATELET
BASOS ABS: 0.2 10*3/uL — AB (ref 0–0.1)
Basophils Relative: 2 %
EOS PCT: 2 %
Eosinophils Absolute: 0.1 10*3/uL (ref 0–0.7)
HCT: 41.6 % (ref 40.0–52.0)
Hemoglobin: 14.2 g/dL (ref 13.0–18.0)
LYMPHS PCT: 45 %
Lymphs Abs: 3.1 10*3/uL (ref 1.0–3.6)
MCH: 32.4 pg (ref 26.0–34.0)
MCHC: 34.2 g/dL (ref 32.0–36.0)
MCV: 94.7 fL (ref 80.0–100.0)
MONO ABS: 0.8 10*3/uL (ref 0.2–1.0)
MONOS PCT: 12 %
Neutro Abs: 2.7 10*3/uL (ref 1.4–6.5)
Neutrophils Relative %: 39 %
PLATELETS: 223 10*3/uL (ref 150–440)
RBC: 4.4 MIL/uL (ref 4.40–5.90)
RDW: 13.2 % (ref 11.5–14.5)
WBC: 6.9 10*3/uL (ref 3.8–10.6)

## 2016-02-19 MED ORDER — SODIUM CHLORIDE 0.9 % IV SOLN
300.0000 mg | Freq: Once | INTRAVENOUS | Status: AC
  Administered 2016-02-19: 300 mg via INTRAVENOUS
  Filled 2016-02-19: qty 15

## 2016-02-19 MED ORDER — ACETAMINOPHEN 500 MG PO TABS
1000.0000 mg | ORAL_TABLET | Freq: Once | ORAL | Status: AC
  Administered 2016-02-19: 1000 mg via ORAL
  Filled 2016-02-19: qty 2

## 2016-02-19 MED ORDER — SODIUM CHLORIDE 0.9 % IV SOLN
Freq: Once | INTRAVENOUS | Status: AC
  Administered 2016-02-19: 10:00:00 via INTRAVENOUS
  Filled 2016-02-19: qty 1000

## 2016-03-17 NOTE — Progress Notes (Signed)
[facility]  Telephone:([phone] [phone]) [phone]  ID: Jerry Bautista OB: 07-06-1976  MR#: [id number]  AOZ#:[id number]#:[id number]  Dr. Meredeth IdeFleming with [facility], [place]  CHIEF COMPLAINT: Tysabri infusion for multiple sclerosis.   INTERVAL HISTORY: Patient returns to clinic for routine 6 month evaluation and continuation of Tysabri infusions. He reports his JC virus is negative. He currently feels well and is asymptomatic. He denies any recent fevers or illnesses. He has no new neurologic complaints. He has a good appetite and denies weight loss. Patient feels at his baseline and offers no specific complaints today.  REVIEW OF SYSTEMS:   Review of Systems  Constitutional: Negative for fever, malaise/fatigue and weight loss.  Respiratory: Negative.  Negative for cough and shortness of breath.   Cardiovascular: Negative.  Negative for chest pain.  Gastrointestinal: Negative.  Negative for abdominal pain.  Genitourinary: Negative.   Musculoskeletal: Negative.   Neurological: Negative.  Negative for sensory change and weakness.  Psychiatric/Behavioral: Negative.     As per HPI. Otherwise, a complete review of systems is negatve.  PAST MEDICAL HISTORY: Multiple sclerosis, hypertension.  PAST SURGICAL HISTORY: No past surgical history on file.  FAMILY HISTORY: Reviewed and unchanged. No report of chronic disease.      ADVANCED DIRECTIVES:    HEALTH MAINTENANCE: Social History  Substance Use Topics  . Smoking status: Not on file  . Smokeless tobacco: Not on file  . Alcohol use Not on file     Colonoscopy:  PAP:  Bone density:  Lipid panel:  No Known Allergies  Current Outpatient Prescriptions  Medication Sig Dispense Refill  . dalfampridine (AMPYRA) 10 MG TB12 Take 10 mg by mouth 2 (two) times daily.    . ferrous fumarate (HEMOCYTE - 106 MG FE) 325 (106 FE) MG TABS tablet Take 1 tablet by mouth.    Marland Kitchen. lisinopril-hydrochlorothiazide  (PRINZIDE,ZESTORETIC) 20-25 MG per tablet Take 1 tablet by mouth daily.    . Multiple Vitamin (MULTIVITAMIN) tablet Take 1 tablet by mouth daily.    . Omega-3 Fatty Acids (FISH OIL) 1000 MG CAPS Take 1 capsule by mouth daily.     No current facility-administered medications for this visit.     OBJECTIVE: ECOG FS:0 - Asymptomatic  General: Well-developed, well-nourished, no acute distress. Eyes: Pink conjunctiva, anicteric sclera. Lungs: Clear to auscultation bilaterally. Heart: Regular rate and rhythm. No rubs, murmurs, or gallops. Abdomen: Soft, nontender, nondistended. No organomegaly noted, normoactive bowel sounds. Musculoskeletal: No edema, cyanosis, or clubbing. Neuro: Alert, answering all questions appropriately. Cranial nerves grossly intact. Skin: No rashes or petechiae noted. Psych: Normal affect.    LAB RESULTS:  Lab Results  Component Value Date   NA 136 03/18/2016   K 4.1 03/18/2016   CL 101 03/18/2016   CO2 29 03/18/2016   GLUCOSE 104 (H) 03/18/2016   BUN 16 03/18/2016   CREATININE 0.90 03/18/2016   CALCIUM 9.4 03/18/2016   PROT 7.6 03/18/2016   ALBUMIN 4.3 03/18/2016   AST 18 03/18/2016   ALT 24 03/18/2016   ALKPHOS 106 03/18/2016   BILITOT 0.8 03/18/2016   GFRNONAA >60 03/18/2016   GFRAA >60 03/18/2016    Lab Results  Component Value Date   WBC 7.7 03/18/2016   NEUTROABS 3.4 03/18/2016   HGB 14.3 03/18/2016   HCT 42.3 03/18/2016   MCV 95.4 03/18/2016   PLT 237 03/18/2016     STUDIES: No results found.  ASSESSMENT:  Tysabri infusion for multiple sclerosis.   PLAN:    1.  Multiple sclerosis: Continue with 300 mg to Tysabri every 4 weeks as ordered by patient's primary neurologist. Patient expressed understanding that all laboratory work including his JC virus titers, will be checked by his Neurologist. He also understands that any questions or concerns regarding Tysabri or his multiple sclerosis should be directed towards his Neurologist.  Return to clinic every 4 weeks for Tysabri and then in 6 months for routine evaluation.  Patient expressed understanding and was in agreement with this plan. He also understands that He can call clinic at any time with any questions, concerns, or complaints.   Neurologist at [facility]:  Satira Sark, MD  Jeralyn Ruths, MD   03/18/2016 12:38 PM

## 2016-03-18 ENCOUNTER — Inpatient Hospital Stay: Payer: Medicare Other | Attending: Oncology

## 2016-03-18 ENCOUNTER — Inpatient Hospital Stay: Payer: Medicare Other

## 2016-03-18 ENCOUNTER — Inpatient Hospital Stay (HOSPITAL_BASED_OUTPATIENT_CLINIC_OR_DEPARTMENT_OTHER): Payer: Medicare Other | Admitting: Oncology

## 2016-03-18 VITALS — BP 111/75 | HR 72 | Temp 97.6°F

## 2016-03-18 DIAGNOSIS — G35 Multiple sclerosis: Secondary | ICD-10-CM

## 2016-03-18 DIAGNOSIS — Z79899 Other long term (current) drug therapy: Secondary | ICD-10-CM

## 2016-03-18 DIAGNOSIS — I10 Essential (primary) hypertension: Secondary | ICD-10-CM | POA: Diagnosis not present

## 2016-03-18 LAB — CBC WITH DIFFERENTIAL/PLATELET
BASOS PCT: 1 %
Basophils Absolute: 0.1 10*3/uL (ref 0–0.1)
Eosinophils Absolute: 0.1 10*3/uL (ref 0–0.7)
Eosinophils Relative: 2 %
HCT: 42.3 % (ref 40.0–52.0)
HEMOGLOBIN: 14.3 g/dL (ref 13.0–18.0)
LYMPHS ABS: 3.2 10*3/uL (ref 1.0–3.6)
LYMPHS PCT: 42 %
MCH: 32.4 pg (ref 26.0–34.0)
MCHC: 33.9 g/dL (ref 32.0–36.0)
MCV: 95.4 fL (ref 80.0–100.0)
MONOS PCT: 12 %
Monocytes Absolute: 0.9 10*3/uL (ref 0.2–1.0)
NEUTROS ABS: 3.4 10*3/uL (ref 1.4–6.5)
NEUTROS PCT: 43 %
PLATELETS: 237 10*3/uL (ref 150–440)
RBC: 4.43 MIL/uL (ref 4.40–5.90)
RDW: 13.3 % (ref 11.5–14.5)
WBC: 7.7 10*3/uL (ref 3.8–10.6)

## 2016-03-18 LAB — COMPREHENSIVE METABOLIC PANEL
ALT: 24 U/L (ref 17–63)
ANION GAP: 6 (ref 5–15)
AST: 18 U/L (ref 15–41)
Albumin: 4.3 g/dL (ref 3.5–5.0)
Alkaline Phosphatase: 106 U/L (ref 38–126)
BUN: 16 mg/dL (ref 6–20)
CALCIUM: 9.4 mg/dL (ref 8.9–10.3)
CHLORIDE: 101 mmol/L (ref 101–111)
CO2: 29 mmol/L (ref 22–32)
Creatinine, Ser: 0.9 mg/dL (ref 0.61–1.24)
GFR calc non Af Amer: >60 mL/min (ref >60)
Glucose, Bld: 104 mg/dL — ABNORMAL HIGH (ref 65–99)
POTASSIUM: 4.1 mmol/L (ref 3.5–5.1)
SODIUM: 136 mmol/L (ref 135–145)
Total Bilirubin: 0.8 mg/dL (ref 0.3–1.2)
Total Protein: 7.6 g/dL (ref 6.5–8.1)

## 2016-03-18 MED ORDER — ACETAMINOPHEN 500 MG PO TABS
1000.0000 mg | ORAL_TABLET | Freq: Once | ORAL | Status: AC
  Administered 2016-03-18: 1000 mg via ORAL
  Filled 2016-03-18: qty 2

## 2016-03-18 MED ORDER — SODIUM CHLORIDE 0.9 % IV SOLN
Freq: Once | INTRAVENOUS | Status: AC
  Administered 2016-03-18: 11:00:00 via INTRAVENOUS
  Filled 2016-03-18: qty 1000

## 2016-03-18 MED ORDER — SODIUM CHLORIDE 0.9 % IV SOLN
300.0000 mg | Freq: Once | INTRAVENOUS | Status: AC
  Administered 2016-03-18: 300 mg via INTRAVENOUS
  Filled 2016-03-18: qty 15

## 2016-03-18 NOTE — Progress Notes (Signed)
Patient here today for 6 month follow up, Tysabri infusion. Patient seen by Dr. Orlie DakinFinnegan in infusion area.

## 2016-04-15 ENCOUNTER — Inpatient Hospital Stay: Payer: Medicare Other

## 2016-04-20 ENCOUNTER — Inpatient Hospital Stay: Payer: Medicare Other

## 2016-04-20 ENCOUNTER — Inpatient Hospital Stay: Payer: Medicare Other | Attending: Oncology

## 2016-04-20 VITALS — BP 109/74 | HR 90 | Resp 18

## 2016-04-20 DIAGNOSIS — G35 Multiple sclerosis: Secondary | ICD-10-CM | POA: Diagnosis present

## 2016-04-20 DIAGNOSIS — Z79899 Other long term (current) drug therapy: Secondary | ICD-10-CM | POA: Insufficient documentation

## 2016-04-20 LAB — CBC WITH DIFFERENTIAL/PLATELET
Basophils Absolute: 0 10*3/uL (ref 0–0.1)
Basophils Relative: 0 %
Eosinophils Absolute: 0.1 10*3/uL (ref 0–0.7)
Eosinophils Relative: 1 %
HEMATOCRIT: 42.2 % (ref 40.0–52.0)
HEMOGLOBIN: 14.4 g/dL (ref 13.0–18.0)
LYMPHS ABS: 3.2 10*3/uL (ref 1.0–3.6)
Lymphocytes Relative: 39 %
MCH: 32.6 pg (ref 26.0–34.0)
MCHC: 34.1 g/dL (ref 32.0–36.0)
MCV: 95.5 fL (ref 80.0–100.0)
MONOS PCT: 12 %
Monocytes Absolute: 1 10*3/uL (ref 0.2–1.0)
Neutro Abs: 3.9 10*3/uL (ref 1.4–6.5)
Neutrophils Relative %: 48 %
Platelets: 230 10*3/uL (ref 150–440)
RBC: 4.42 MIL/uL (ref 4.40–5.90)
RDW: 13.5 % (ref 11.5–14.5)
WBC: 8.2 10*3/uL (ref 3.8–10.6)

## 2016-04-20 LAB — COMPREHENSIVE METABOLIC PANEL
ALK PHOS: 108 U/L (ref 38–126)
ALT: 32 U/L (ref 17–63)
AST: 19 U/L (ref 15–41)
Albumin: 4.3 g/dL (ref 3.5–5.0)
Anion gap: 9 (ref 5–15)
BUN: 19 mg/dL (ref 6–20)
CALCIUM: 9.2 mg/dL (ref 8.9–10.3)
CHLORIDE: 99 mmol/L — AB (ref 101–111)
CO2: 26 mmol/L (ref 22–32)
CREATININE: 0.92 mg/dL (ref 0.61–1.24)
GFR calc non Af Amer: >60 mL/min (ref >60)
Glucose, Bld: 113 mg/dL — ABNORMAL HIGH (ref 65–99)
Potassium: 4.2 mmol/L (ref 3.5–5.1)
SODIUM: 134 mmol/L — AB (ref 135–145)
Total Bilirubin: 0.9 mg/dL (ref 0.3–1.2)
Total Protein: 7.9 g/dL (ref 6.5–8.1)

## 2016-04-20 MED ORDER — SODIUM CHLORIDE 0.9 % IV SOLN
Freq: Once | INTRAVENOUS | Status: AC
  Administered 2016-04-20: 11:00:00 via INTRAVENOUS
  Filled 2016-04-20: qty 1000

## 2016-04-20 MED ORDER — SODIUM CHLORIDE 0.9 % IV SOLN
300.0000 mg | Freq: Once | INTRAVENOUS | Status: AC
  Administered 2016-04-20: 300 mg via INTRAVENOUS
  Filled 2016-04-20: qty 15

## 2016-04-20 MED ORDER — ACETAMINOPHEN 500 MG PO TABS
1000.0000 mg | ORAL_TABLET | Freq: Once | ORAL | Status: AC
  Administered 2016-04-20: 1000 mg via ORAL
  Filled 2016-04-20: qty 2

## 2016-05-13 ENCOUNTER — Inpatient Hospital Stay: Payer: Medicare Other

## 2016-05-18 ENCOUNTER — Inpatient Hospital Stay: Payer: Medicare Other

## 2016-05-18 ENCOUNTER — Inpatient Hospital Stay: Payer: Medicare Other | Attending: Oncology

## 2016-05-18 VITALS — BP 120/72 | HR 80 | Temp 97.3°F | Resp 18

## 2016-05-18 DIAGNOSIS — G35 Multiple sclerosis: Secondary | ICD-10-CM

## 2016-05-18 DIAGNOSIS — Z79899 Other long term (current) drug therapy: Secondary | ICD-10-CM | POA: Insufficient documentation

## 2016-05-18 LAB — COMPREHENSIVE METABOLIC PANEL
ALBUMIN: 4.4 g/dL (ref 3.5–5.0)
ALK PHOS: 102 U/L (ref 38–126)
ALT: 33 U/L (ref 17–63)
ANION GAP: 7 (ref 5–15)
AST: 24 U/L (ref 15–41)
BILIRUBIN TOTAL: 0.9 mg/dL (ref 0.3–1.2)
BUN: 21 mg/dL — ABNORMAL HIGH (ref 6–20)
CALCIUM: 9.3 mg/dL (ref 8.9–10.3)
CO2: 27 mmol/L (ref 22–32)
Chloride: 100 mmol/L — ABNORMAL LOW (ref 101–111)
Creatinine, Ser: 0.95 mg/dL (ref 0.61–1.24)
GLUCOSE: 109 mg/dL — AB (ref 65–99)
Potassium: 4.3 mmol/L (ref 3.5–5.1)
Sodium: 134 mmol/L — ABNORMAL LOW (ref 135–145)
TOTAL PROTEIN: 7.9 g/dL (ref 6.5–8.1)

## 2016-05-18 LAB — CBC WITH DIFFERENTIAL/PLATELET
BASOS ABS: 0 10*3/uL (ref 0–0.1)
BASOS PCT: 0 %
EOS ABS: 0.1 10*3/uL (ref 0–0.7)
Eosinophils Relative: 2 %
HCT: 43.8 % (ref 40.0–52.0)
HEMOGLOBIN: 15 g/dL (ref 13.0–18.0)
Lymphocytes Relative: 39 %
Lymphs Abs: 3.1 10*3/uL (ref 1.0–3.6)
MCH: 33 pg (ref 26.0–34.0)
MCHC: 34.4 g/dL (ref 32.0–36.0)
MCV: 96.1 fL (ref 80.0–100.0)
MONO ABS: 0.9 10*3/uL (ref 0.2–1.0)
MONOS PCT: 11 %
NEUTROS PCT: 48 %
Neutro Abs: 3.7 10*3/uL (ref 1.4–6.5)
Platelets: 247 10*3/uL (ref 150–440)
RBC: 4.56 MIL/uL (ref 4.40–5.90)
RDW: 13 % (ref 11.5–14.5)
WBC: 7.8 10*3/uL (ref 3.8–10.6)

## 2016-05-18 MED ORDER — SODIUM CHLORIDE 0.9 % IV SOLN
Freq: Once | INTRAVENOUS | Status: AC
  Administered 2016-05-18: 11:00:00 via INTRAVENOUS
  Filled 2016-05-18: qty 1000

## 2016-05-18 MED ORDER — ACETAMINOPHEN 500 MG PO TABS
1000.0000 mg | ORAL_TABLET | Freq: Once | ORAL | Status: AC
  Administered 2016-05-18: 1000 mg via ORAL
  Filled 2016-05-18: qty 2

## 2016-05-18 MED ORDER — SODIUM CHLORIDE 0.9 % IV SOLN
300.0000 mg | Freq: Once | INTRAVENOUS | Status: AC
  Administered 2016-05-18: 300 mg via INTRAVENOUS
  Filled 2016-05-18: qty 15

## 2016-06-10 ENCOUNTER — Inpatient Hospital Stay: Payer: Medicare Other

## 2016-06-14 ENCOUNTER — Other Ambulatory Visit: Payer: Self-pay | Admitting: *Deleted

## 2016-06-14 DIAGNOSIS — G35 Multiple sclerosis: Secondary | ICD-10-CM

## 2016-06-15 ENCOUNTER — Inpatient Hospital Stay: Payer: Medicare Other | Attending: Hematology and Oncology

## 2016-06-15 ENCOUNTER — Inpatient Hospital Stay: Payer: Medicare Other | Attending: Oncology

## 2016-06-15 VITALS — BP 101/63 | HR 68 | Temp 98.5°F | Resp 16

## 2016-06-15 DIAGNOSIS — Z79899 Other long term (current) drug therapy: Secondary | ICD-10-CM | POA: Insufficient documentation

## 2016-06-15 DIAGNOSIS — G35 Multiple sclerosis: Secondary | ICD-10-CM

## 2016-06-15 LAB — COMPREHENSIVE METABOLIC PANEL
ALBUMIN: 4.3 g/dL (ref 3.5–5.0)
ALK PHOS: 105 U/L (ref 38–126)
ALT: 50 U/L (ref 17–63)
AST: 34 U/L (ref 15–41)
Anion gap: 7 (ref 5–15)
BILIRUBIN TOTAL: 0.9 mg/dL (ref 0.3–1.2)
BUN: 17 mg/dL (ref 6–20)
CALCIUM: 9.3 mg/dL (ref 8.9–10.3)
CO2: 26 mmol/L (ref 22–32)
CREATININE: 0.9 mg/dL (ref 0.61–1.24)
Chloride: 98 mmol/L — ABNORMAL LOW (ref 101–111)
GFR calc non Af Amer: >60 mL/min (ref >60)
GLUCOSE: 130 mg/dL — AB (ref 65–99)
Potassium: 4.1 mmol/L (ref 3.5–5.1)
SODIUM: 131 mmol/L — AB (ref 135–145)
TOTAL PROTEIN: 8 g/dL (ref 6.5–8.1)

## 2016-06-15 LAB — CBC WITH DIFFERENTIAL/PLATELET
Basophils Absolute: 0 10*3/uL (ref 0–0.1)
Basophils Relative: 0 %
EOS ABS: 0.3 10*3/uL (ref 0–0.7)
Eosinophils Relative: 3 %
HEMATOCRIT: 40.4 % (ref 40.0–52.0)
HEMOGLOBIN: 13.8 g/dL (ref 13.0–18.0)
LYMPHS ABS: 3.1 10*3/uL (ref 1.0–3.6)
LYMPHS PCT: 32 %
MCH: 32.9 pg (ref 26.0–34.0)
MCHC: 34.3 g/dL (ref 32.0–36.0)
MCV: 95.8 fL (ref 80.0–100.0)
MONOS PCT: 18 %
Monocytes Absolute: 1.8 10*3/uL — ABNORMAL HIGH (ref 0.2–1.0)
NEUTROS ABS: 4.5 10*3/uL (ref 1.4–6.5)
NEUTROS PCT: 47 %
Platelets: 223 10*3/uL (ref 150–440)
RBC: 4.21 MIL/uL — AB (ref 4.40–5.90)
RDW: 13 % (ref 11.5–14.5)
WBC: 9.7 10*3/uL (ref 3.8–10.6)

## 2016-06-15 MED ORDER — SODIUM CHLORIDE 0.9 % IV SOLN
300.0000 mg | Freq: Once | INTRAVENOUS | Status: AC
  Administered 2016-06-15: 300 mg via INTRAVENOUS
  Filled 2016-06-15: qty 15

## 2016-06-15 MED ORDER — SODIUM CHLORIDE 0.9 % IV SOLN
Freq: Once | INTRAVENOUS | Status: AC
  Administered 2016-06-15: 11:00:00 via INTRAVENOUS
  Filled 2016-06-15: qty 1000

## 2016-06-15 MED ORDER — ACETAMINOPHEN 500 MG PO TABS
1000.0000 mg | ORAL_TABLET | Freq: Once | ORAL | Status: AC
  Administered 2016-06-15: 1000 mg via ORAL
  Filled 2016-06-15: qty 2

## 2016-07-08 ENCOUNTER — Inpatient Hospital Stay: Payer: Medicare Other

## 2016-07-13 ENCOUNTER — Other Ambulatory Visit: Payer: Self-pay | Admitting: *Deleted

## 2016-07-13 ENCOUNTER — Inpatient Hospital Stay: Payer: Medicare Other | Attending: Oncology

## 2016-07-13 ENCOUNTER — Inpatient Hospital Stay: Payer: Medicare Other

## 2016-07-13 VITALS — BP 118/82 | HR 75 | Temp 97.0°F | Resp 17

## 2016-07-13 DIAGNOSIS — G35 Multiple sclerosis: Secondary | ICD-10-CM | POA: Diagnosis present

## 2016-07-13 DIAGNOSIS — Z79899 Other long term (current) drug therapy: Secondary | ICD-10-CM | POA: Insufficient documentation

## 2016-07-13 LAB — COMPREHENSIVE METABOLIC PANEL
ALT: 48 U/L (ref 17–63)
AST: 27 U/L (ref 15–41)
Albumin: 4.3 g/dL (ref 3.5–5.0)
Alkaline Phosphatase: 94 U/L (ref 38–126)
Anion gap: 7 (ref 5–15)
BUN: 16 mg/dL (ref 6–20)
CHLORIDE: 100 mmol/L — AB (ref 101–111)
CO2: 26 mmol/L (ref 22–32)
CREATININE: 0.94 mg/dL (ref 0.61–1.24)
Calcium: 9.2 mg/dL (ref 8.9–10.3)
GFR calc Af Amer: >60 mL/min (ref >60)
GLUCOSE: 101 mg/dL — AB (ref 65–99)
POTASSIUM: 4.4 mmol/L (ref 3.5–5.1)
Sodium: 133 mmol/L — ABNORMAL LOW (ref 135–145)
Total Bilirubin: 0.6 mg/dL (ref 0.3–1.2)
Total Protein: 7.7 g/dL (ref 6.5–8.1)

## 2016-07-13 LAB — CBC WITH DIFFERENTIAL/PLATELET
BASOS ABS: 0 10*3/uL (ref 0–0.1)
BASOS PCT: 0 %
EOS PCT: 2 %
Eosinophils Absolute: 0.1 10*3/uL (ref 0–0.7)
HCT: 42.7 % (ref 40.0–52.0)
Hemoglobin: 14.3 g/dL (ref 13.0–18.0)
LYMPHS PCT: 42 %
Lymphs Abs: 2.8 10*3/uL (ref 1.0–3.6)
MCH: 32.6 pg (ref 26.0–34.0)
MCHC: 33.6 g/dL (ref 32.0–36.0)
MCV: 97.1 fL (ref 80.0–100.0)
Monocytes Absolute: 0.8 10*3/uL (ref 0.2–1.0)
Monocytes Relative: 12 %
NEUTROS ABS: 3 10*3/uL (ref 1.4–6.5)
Neutrophils Relative %: 44 %
PLATELETS: 212 10*3/uL (ref 150–440)
RBC: 4.4 MIL/uL (ref 4.40–5.90)
RDW: 13 % (ref 11.5–14.5)
WBC: 6.8 10*3/uL (ref 3.8–10.6)

## 2016-07-13 MED ORDER — ACETAMINOPHEN 500 MG PO TABS
1000.0000 mg | ORAL_TABLET | Freq: Once | ORAL | Status: AC
  Administered 2016-07-13: 1000 mg via ORAL
  Filled 2016-07-13: qty 2

## 2016-07-13 MED ORDER — SODIUM CHLORIDE 0.9 % IV SOLN
Freq: Once | INTRAVENOUS | Status: AC
  Administered 2016-07-13: 11:00:00 via INTRAVENOUS
  Filled 2016-07-13: qty 1000

## 2016-07-13 MED ORDER — SODIUM CHLORIDE 0.9 % IV SOLN
300.0000 mg | Freq: Once | INTRAVENOUS | Status: AC
  Administered 2016-07-13: 300 mg via INTRAVENOUS
  Filled 2016-07-13: qty 15

## 2016-08-05 ENCOUNTER — Inpatient Hospital Stay: Payer: Medicare Other

## 2016-08-09 ENCOUNTER — Other Ambulatory Visit: Payer: Self-pay | Admitting: *Deleted

## 2016-08-09 ENCOUNTER — Telehealth: Payer: Self-pay | Admitting: Pharmacist

## 2016-08-09 DIAGNOSIS — G35 Multiple sclerosis: Secondary | ICD-10-CM

## 2016-08-09 NOTE — Telephone Encounter (Signed)
Left voicemail to remind patient of appointment on Wednesday December 10th.

## 2016-08-10 ENCOUNTER — Inpatient Hospital Stay: Payer: Medicare Other

## 2016-08-10 ENCOUNTER — Inpatient Hospital Stay: Payer: Medicare Other | Attending: Oncology

## 2016-08-10 ENCOUNTER — Other Ambulatory Visit: Payer: Medicare Other

## 2016-08-10 ENCOUNTER — Other Ambulatory Visit: Payer: Self-pay | Admitting: Oncology

## 2016-08-10 ENCOUNTER — Ambulatory Visit: Payer: Medicare Other

## 2016-08-10 VITALS — BP 124/86 | HR 70 | Temp 96.1°F | Resp 18

## 2016-08-10 DIAGNOSIS — G35 Multiple sclerosis: Secondary | ICD-10-CM | POA: Diagnosis not present

## 2016-08-10 DIAGNOSIS — Z79899 Other long term (current) drug therapy: Secondary | ICD-10-CM | POA: Diagnosis not present

## 2016-08-10 LAB — CBC WITH DIFFERENTIAL/PLATELET
BASOS ABS: 0 10*3/uL (ref 0–0.1)
BASOS PCT: 0 %
Eosinophils Absolute: 0.1 10*3/uL (ref 0–0.7)
Eosinophils Relative: 1 %
HEMATOCRIT: 42 % (ref 40.0–52.0)
HEMOGLOBIN: 14.3 g/dL (ref 13.0–18.0)
LYMPHS PCT: 42 %
Lymphs Abs: 3.3 10*3/uL (ref 1.0–3.6)
MCH: 32.6 pg (ref 26.0–34.0)
MCHC: 34.1 g/dL (ref 32.0–36.0)
MCV: 95.8 fL (ref 80.0–100.0)
MONO ABS: 0.9 10*3/uL (ref 0.2–1.0)
Monocytes Relative: 12 %
NEUTROS ABS: 3.5 10*3/uL (ref 1.4–6.5)
NEUTROS PCT: 45 %
Platelets: 247 10*3/uL (ref 150–440)
RBC: 4.38 MIL/uL — AB (ref 4.40–5.90)
RDW: 12.8 % (ref 11.5–14.5)
WBC: 7.8 10*3/uL (ref 3.8–10.6)

## 2016-08-10 LAB — COMPREHENSIVE METABOLIC PANEL
ALBUMIN: 4.3 g/dL (ref 3.5–5.0)
ALT: 33 U/L (ref 17–63)
AST: 24 U/L (ref 15–41)
Alkaline Phosphatase: 92 U/L (ref 38–126)
Anion gap: 5 (ref 5–15)
BILIRUBIN TOTAL: 0.7 mg/dL (ref 0.3–1.2)
BUN: 16 mg/dL (ref 6–20)
CO2: 28 mmol/L (ref 22–32)
CREATININE: 0.88 mg/dL (ref 0.61–1.24)
Calcium: 9.1 mg/dL (ref 8.9–10.3)
Chloride: 100 mmol/L — ABNORMAL LOW (ref 101–111)
GFR calc Af Amer: >60 mL/min (ref >60)
GLUCOSE: 123 mg/dL — AB (ref 65–99)
POTASSIUM: 3.9 mmol/L (ref 3.5–5.1)
Sodium: 133 mmol/L — ABNORMAL LOW (ref 135–145)
TOTAL PROTEIN: 7.7 g/dL (ref 6.5–8.1)

## 2016-08-10 MED ORDER — SODIUM CHLORIDE 0.9 % IV SOLN
300.0000 mg | Freq: Once | INTRAVENOUS | Status: AC
  Administered 2016-08-10: 300 mg via INTRAVENOUS
  Filled 2016-08-10: qty 15

## 2016-08-10 MED ORDER — ACETAMINOPHEN 500 MG PO TABS
1000.0000 mg | ORAL_TABLET | Freq: Once | ORAL | Status: AC
  Administered 2016-08-10: 1000 mg via ORAL
  Filled 2016-08-10: qty 2

## 2016-08-10 MED ORDER — SODIUM CHLORIDE 0.9 % IV SOLN
Freq: Once | INTRAVENOUS | Status: AC
Start: 2016-08-10 — End: 2016-08-10
  Administered 2016-08-10: 12:00:00 via INTRAVENOUS
  Filled 2016-08-10: qty 1000

## 2016-09-02 ENCOUNTER — Inpatient Hospital Stay: Payer: Medicare Other

## 2016-09-07 ENCOUNTER — Inpatient Hospital Stay: Payer: Medicare Other

## 2016-09-07 ENCOUNTER — Ambulatory Visit: Payer: Medicare Other

## 2016-09-07 ENCOUNTER — Other Ambulatory Visit: Payer: Medicare Other

## 2016-09-07 ENCOUNTER — Other Ambulatory Visit: Payer: Self-pay | Admitting: *Deleted

## 2016-09-07 DIAGNOSIS — G35 Multiple sclerosis: Secondary | ICD-10-CM

## 2016-09-07 NOTE — Progress Notes (Signed)
[facility]  Telephone:(336) [phone] Fax:(336) [phone]  ID: Jerry Bautista OB: 10/10/1975  MR#: [id number]  CVE#:[id number]  Dr. Meredeth Ide with [facility], [place], [place]  CHIEF COMPLAINT: Tysabri infusion for multiple sclerosis.   INTERVAL HISTORY: Patient returns to clinic for routine 6 month evaluation and continuation of Tysabri infusions. He reports his JC virus is negative. He currently feels well and is asymptomatic. He denies any recent fevers or illnesses. He has no new neurologic complaints. He has a good appetite and denies weight loss. Patient feels at his baseline and offers no specific complaints today.  REVIEW OF SYSTEMS:   Review of Systems  Constitutional: Negative for fever, malaise/fatigue and weight loss.  Respiratory: Negative.  Negative for cough and shortness of breath.   Cardiovascular: Negative.  Negative for chest pain.  Gastrointestinal: Negative.  Negative for abdominal pain.  Genitourinary: Negative.   Musculoskeletal: Negative.   Neurological: Negative.  Negative for sensory change and weakness.  Psychiatric/Behavioral: Negative.     As per HPI. Otherwise, a complete review of systems is negative.  PAST MEDICAL HISTORY: Multiple sclerosis, hypertension.  PAST SURGICAL HISTORY: No past surgical history on file.  FAMILY HISTORY: Reviewed and unchanged. No report of chronic disease.      ADVANCED DIRECTIVES:    HEALTH MAINTENANCE: Social History  Substance Use Topics  . Smoking status: Not on file  . Smokeless tobacco: Not on file  . Alcohol use Not on file     Colonoscopy:  PAP:  Bone density:  Lipid panel:  No Known Allergies  Current Outpatient Prescriptions  Medication Sig Dispense Refill  . dalfampridine (AMPYRA) 10 MG TB12 Take 10 mg by mouth 2 (two) times daily.    . ferrous fumarate (HEMOCYTE - 106 MG FE) 325 (106 FE) MG TABS tablet Take 1 tablet by mouth.    Marland Kitchen lisinopril-hydrochlorothiazide  (PRINZIDE,ZESTORETIC) 20-25 MG per tablet Take 1 tablet by mouth daily.    . Multiple Vitamin (MULTIVITAMIN) tablet Take 1 tablet by mouth daily.    . Omega-3 Fatty Acids (FISH OIL) 1000 MG CAPS Take 1 capsule by mouth daily.     No current facility-administered medications for this visit.     OBJECTIVE: ECOG FS:0 - Asymptomatic  General: Well-developed, well-nourished, no acute distress. Eyes: Pink conjunctiva, anicteric sclera. Lungs: Clear to auscultation bilaterally. Heart: Regular rate and rhythm. No rubs, murmurs, or gallops. Abdomen: Soft, nontender, nondistended. No organomegaly noted, normoactive bowel sounds. Musculoskeletal: No edema, cyanosis, or clubbing. Neuro: Alert, answering all questions appropriately. Cranial nerves grossly intact. Skin: No rashes or petechiae noted. Psych: Normal affect.    LAB RESULTS:  Lab Results  Component Value Date   NA 135 09/09/2016   K 4.2 09/09/2016   CL 101 09/09/2016   CO2 28 09/09/2016   GLUCOSE 100 (H) 09/09/2016   BUN 19 09/09/2016   CREATININE 0.77 09/09/2016   CALCIUM 9.3 09/09/2016   PROT 7.6 09/09/2016   ALBUMIN 4.3 09/09/2016   AST 23 09/09/2016   ALT 30 09/09/2016   ALKPHOS 93 09/09/2016   BILITOT 0.6 09/09/2016   GFRNONAA >60 09/09/2016   GFRAA >60 09/09/2016    Lab Results  Component Value Date   WBC 7.7 09/09/2016   NEUTROABS 3.8 09/09/2016   HGB 14.2 09/09/2016   HCT 41.1 09/09/2016   MCV 95.8 09/09/2016   PLT 241 09/09/2016     STUDIES: No results found.  ASSESSMENT:  Tysabri infusion for multiple sclerosis.   PLAN:    1.  Multiple sclerosis: Continue with 300 mg to Tysabri every 4 weeks as ordered by patient's primary neurologist. Patient expressed understanding that all laboratory work including his JC virus titers, will be checked by his Neurologist. He also understands that any questions or concerns regarding Tysabri or his multiple sclerosis should be directed towards his Neurologist.  Return to clinic every 4 weeks for Tysabri and then in 6 months for routine evaluation.  Patient expressed understanding and was in agreement with this plan. He also understands that He can call clinic at any time with any questions, concerns, or complaints.   Neurologist at [facility]:  Satira Sark, MD  Jeralyn Ruths, MD   09/09/2016 3:19 PM

## 2016-09-09 ENCOUNTER — Inpatient Hospital Stay: Payer: Medicare Other | Attending: Oncology | Admitting: Oncology

## 2016-09-09 ENCOUNTER — Inpatient Hospital Stay: Payer: Medicare Other

## 2016-09-09 VITALS — BP 108/74 | HR 80 | Temp 96.5°F | Resp 18

## 2016-09-09 DIAGNOSIS — G35 Multiple sclerosis: Secondary | ICD-10-CM | POA: Insufficient documentation

## 2016-09-09 DIAGNOSIS — I10 Essential (primary) hypertension: Secondary | ICD-10-CM | POA: Insufficient documentation

## 2016-09-09 DIAGNOSIS — Z79899 Other long term (current) drug therapy: Secondary | ICD-10-CM | POA: Insufficient documentation

## 2016-09-09 LAB — CBC WITH DIFFERENTIAL/PLATELET
BASOS ABS: 0 10*3/uL (ref 0–0.1)
BASOS PCT: 1 %
EOS PCT: 2 %
Eosinophils Absolute: 0.1 10*3/uL (ref 0–0.7)
HCT: 41.1 % (ref 40.0–52.0)
Hemoglobin: 14.2 g/dL (ref 13.0–18.0)
LYMPHS PCT: 39 %
Lymphs Abs: 3 10*3/uL (ref 1.0–3.6)
MCH: 33.2 pg (ref 26.0–34.0)
MCHC: 34.7 g/dL (ref 32.0–36.0)
MCV: 95.8 fL (ref 80.0–100.0)
MONO ABS: 0.7 10*3/uL (ref 0.2–1.0)
Monocytes Relative: 10 %
NEUTROS ABS: 3.8 10*3/uL (ref 1.4–6.5)
Neutrophils Relative %: 48 %
PLATELETS: 241 10*3/uL (ref 150–440)
RBC: 4.28 MIL/uL — AB (ref 4.40–5.90)
RDW: 12.8 % (ref 11.5–14.5)
WBC: 7.7 10*3/uL (ref 3.8–10.6)

## 2016-09-09 LAB — COMPREHENSIVE METABOLIC PANEL
ALBUMIN: 4.3 g/dL (ref 3.5–5.0)
ALT: 30 U/L (ref 17–63)
AST: 23 U/L (ref 15–41)
Alkaline Phosphatase: 93 U/L (ref 38–126)
Anion gap: 6 (ref 5–15)
BUN: 19 mg/dL (ref 6–20)
CHLORIDE: 101 mmol/L (ref 101–111)
CO2: 28 mmol/L (ref 22–32)
CREATININE: 0.77 mg/dL (ref 0.61–1.24)
Calcium: 9.3 mg/dL (ref 8.9–10.3)
GFR calc Af Amer: >60 mL/min (ref >60)
GFR calc non Af Amer: >60 mL/min (ref >60)
GLUCOSE: 100 mg/dL — AB (ref 65–99)
POTASSIUM: 4.2 mmol/L (ref 3.5–5.1)
Sodium: 135 mmol/L (ref 135–145)
Total Bilirubin: 0.6 mg/dL (ref 0.3–1.2)
Total Protein: 7.6 g/dL (ref 6.5–8.1)

## 2016-09-09 MED ORDER — ACETAMINOPHEN 500 MG PO TABS
1000.0000 mg | ORAL_TABLET | Freq: Once | ORAL | Status: AC
  Administered 2016-09-09: 1000 mg via ORAL
  Filled 2016-09-09: qty 2

## 2016-09-09 MED ORDER — SODIUM CHLORIDE 0.9 % IV SOLN
300.0000 mg | Freq: Once | INTRAVENOUS | Status: AC
  Administered 2016-09-09: 300 mg via INTRAVENOUS
  Filled 2016-09-09: qty 15

## 2016-09-09 MED ORDER — SODIUM CHLORIDE 0.9 % IV SOLN
Freq: Once | INTRAVENOUS | Status: AC
  Administered 2016-09-09: 11:00:00 via INTRAVENOUS
  Filled 2016-09-09: qty 1000

## 2016-09-09 NOTE — Progress Notes (Signed)
Pt seen in infusion area. 

## 2016-10-05 ENCOUNTER — Other Ambulatory Visit: Payer: Medicare Other

## 2016-10-05 ENCOUNTER — Ambulatory Visit: Payer: Medicare Other

## 2016-10-06 MED ORDER — SODIUM CHLORIDE 0.9 % IV SOLN
300.0000 mg | Freq: Once | INTRAVENOUS | Status: AC
  Administered 2016-10-07: 300 mg via INTRAVENOUS
  Filled 2016-10-06: qty 15

## 2016-10-06 MED ORDER — ACETAMINOPHEN 500 MG PO TABS
1000.0000 mg | ORAL_TABLET | Freq: Once | ORAL | Status: AC
  Administered 2016-10-07: 1000 mg via ORAL
  Filled 2016-10-06: qty 2

## 2016-10-06 MED ORDER — SODIUM CHLORIDE 0.9 % IV SOLN
Freq: Once | INTRAVENOUS | Status: AC
  Administered 2016-10-07: 11:00:00 via INTRAVENOUS
  Filled 2016-10-06: qty 1000

## 2016-10-07 ENCOUNTER — Inpatient Hospital Stay: Payer: Medicare Other

## 2016-10-07 ENCOUNTER — Other Ambulatory Visit: Payer: Self-pay

## 2016-10-07 ENCOUNTER — Inpatient Hospital Stay: Payer: Medicare Other | Attending: Oncology

## 2016-10-07 VITALS — BP 120/76 | HR 80 | Temp 96.7°F | Resp 18

## 2016-10-07 DIAGNOSIS — G35 Multiple sclerosis: Secondary | ICD-10-CM

## 2016-10-07 DIAGNOSIS — Z79899 Other long term (current) drug therapy: Secondary | ICD-10-CM | POA: Insufficient documentation

## 2016-10-07 LAB — CBC WITH DIFFERENTIAL/PLATELET
Basophils Absolute: 0 10*3/uL (ref 0–0.1)
Basophils Relative: 0 %
Eosinophils Absolute: 0.2 10*3/uL (ref 0–0.7)
Eosinophils Relative: 2 %
HCT: 42.2 % (ref 40.0–52.0)
Hemoglobin: 14.4 g/dL (ref 13.0–18.0)
Lymphocytes Relative: 38 %
Lymphs Abs: 3.3 10*3/uL (ref 1.0–3.6)
MCH: 33 pg (ref 26.0–34.0)
MCHC: 34.1 g/dL (ref 32.0–36.0)
MCV: 96.7 fL (ref 80.0–100.0)
Monocytes Absolute: 1 10*3/uL (ref 0.2–1.0)
Monocytes Relative: 11 %
Neutro Abs: 4.3 10*3/uL (ref 1.4–6.5)
Neutrophils Relative %: 49 %
Platelets: 255 10*3/uL (ref 150–440)
RBC: 4.36 MIL/uL — ABNORMAL LOW (ref 4.40–5.90)
RDW: 13.2 % (ref 11.5–14.5)
WBC: 8.8 10*3/uL (ref 3.8–10.6)

## 2016-10-07 LAB — COMPREHENSIVE METABOLIC PANEL WITH GFR
ALT: 30 U/L (ref 17–63)
AST: 24 U/L (ref 15–41)
Albumin: 4.3 g/dL (ref 3.5–5.0)
Alkaline Phosphatase: 93 U/L (ref 38–126)
Anion gap: 5 (ref 5–15)
BUN: 16 mg/dL (ref 6–20)
CO2: 29 mmol/L (ref 22–32)
Calcium: 9.4 mg/dL (ref 8.9–10.3)
Chloride: 103 mmol/L (ref 101–111)
Creatinine, Ser: 0.78 mg/dL (ref 0.61–1.24)
GFR calc Af Amer: >60 mL/min
GFR calc non Af Amer: >60 mL/min
Glucose, Bld: 104 mg/dL — ABNORMAL HIGH (ref 65–99)
Potassium: 4.2 mmol/L (ref 3.5–5.1)
Sodium: 137 mmol/L (ref 135–145)
Total Bilirubin: 0.5 mg/dL (ref 0.3–1.2)
Total Protein: 7.5 g/dL (ref 6.5–8.1)

## 2016-10-07 NOTE — Patient Instructions (Signed)

## 2016-10-25 ENCOUNTER — Other Ambulatory Visit: Payer: Self-pay

## 2016-10-25 DIAGNOSIS — G35 Multiple sclerosis: Secondary | ICD-10-CM

## 2016-11-02 ENCOUNTER — Ambulatory Visit: Payer: Medicare Other

## 2016-11-02 ENCOUNTER — Other Ambulatory Visit: Payer: Medicare Other

## 2016-11-03 MED ORDER — SODIUM CHLORIDE 0.9 % IV SOLN
300.0000 mg | Freq: Once | INTRAVENOUS | Status: AC
  Administered 2016-11-04: 300 mg via INTRAVENOUS
  Filled 2016-11-03: qty 15

## 2016-11-03 MED ORDER — ACETAMINOPHEN 500 MG PO TABS
1000.0000 mg | ORAL_TABLET | Freq: Once | ORAL | Status: AC
  Administered 2016-11-04: 1000 mg via ORAL
  Filled 2016-11-03: qty 2

## 2016-11-03 MED ORDER — SODIUM CHLORIDE 0.9 % IV SOLN
Freq: Once | INTRAVENOUS | Status: AC
  Administered 2016-11-04: 11:00:00 via INTRAVENOUS
  Filled 2016-11-03: qty 1000

## 2016-11-04 ENCOUNTER — Inpatient Hospital Stay: Payer: Medicare Other

## 2016-11-04 ENCOUNTER — Inpatient Hospital Stay: Payer: Medicare Other | Attending: Oncology

## 2016-11-04 VITALS — BP 108/74 | HR 83 | Temp 96.7°F | Resp 18

## 2016-11-04 DIAGNOSIS — Z79899 Other long term (current) drug therapy: Secondary | ICD-10-CM | POA: Diagnosis not present

## 2016-11-04 DIAGNOSIS — G35 Multiple sclerosis: Secondary | ICD-10-CM | POA: Diagnosis not present

## 2016-11-04 LAB — CBC WITH DIFFERENTIAL/PLATELET
BASOS ABS: 0 10*3/uL (ref 0–0.1)
BASOS PCT: 1 %
EOS ABS: 0.1 10*3/uL (ref 0–0.7)
Eosinophils Relative: 2 %
HCT: 41.7 % (ref 40.0–52.0)
Hemoglobin: 14.3 g/dL (ref 13.0–18.0)
Lymphocytes Relative: 39 %
Lymphs Abs: 3.1 10*3/uL (ref 1.0–3.6)
MCH: 32.9 pg (ref 26.0–34.0)
MCHC: 34.2 g/dL (ref 32.0–36.0)
MCV: 96 fL (ref 80.0–100.0)
MONOS PCT: 11 %
Monocytes Absolute: 0.9 10*3/uL (ref 0.2–1.0)
NEUTROS ABS: 3.8 10*3/uL (ref 1.4–6.5)
NEUTROS PCT: 47 %
PLATELETS: 233 10*3/uL (ref 150–440)
RBC: 4.35 MIL/uL — AB (ref 4.40–5.90)
RDW: 13.1 % (ref 11.5–14.5)
WBC: 8 10*3/uL (ref 3.8–10.6)

## 2016-11-04 LAB — COMPREHENSIVE METABOLIC PANEL
ALT: 27 U/L (ref 17–63)
AST: 21 U/L (ref 15–41)
Albumin: 4.2 g/dL (ref 3.5–5.0)
Alkaline Phosphatase: 103 U/L (ref 38–126)
Anion gap: 3 — ABNORMAL LOW (ref 5–15)
BUN: 16 mg/dL (ref 6–20)
CHLORIDE: 104 mmol/L (ref 101–111)
CO2: 29 mmol/L (ref 22–32)
CREATININE: 0.81 mg/dL (ref 0.61–1.24)
Calcium: 9.6 mg/dL (ref 8.9–10.3)
Glucose, Bld: 108 mg/dL — ABNORMAL HIGH (ref 65–99)
POTASSIUM: 4.2 mmol/L (ref 3.5–5.1)
Sodium: 136 mmol/L (ref 135–145)
Total Bilirubin: 0.7 mg/dL (ref 0.3–1.2)
Total Protein: 7.6 g/dL (ref 6.5–8.1)

## 2016-11-04 NOTE — Patient Instructions (Signed)

## 2016-11-30 ENCOUNTER — Ambulatory Visit: Payer: Medicare Other

## 2016-11-30 ENCOUNTER — Other Ambulatory Visit: Payer: Medicare Other

## 2016-12-01 ENCOUNTER — Inpatient Hospital Stay: Payer: Medicare Other

## 2016-12-02 ENCOUNTER — Inpatient Hospital Stay: Payer: Medicare Other

## 2016-12-02 MED ORDER — SODIUM CHLORIDE 0.9 % IV SOLN
Freq: Once | INTRAVENOUS | Status: AC
  Administered 2016-12-05: 11:00:00 via INTRAVENOUS
  Filled 2016-12-02: qty 1000

## 2016-12-02 MED ORDER — ACETAMINOPHEN 500 MG PO TABS
1000.0000 mg | ORAL_TABLET | Freq: Once | ORAL | Status: AC
  Administered 2016-12-05: 1000 mg via ORAL
  Filled 2016-12-02: qty 2

## 2016-12-02 MED ORDER — SODIUM CHLORIDE 0.9 % IV SOLN
300.0000 mg | Freq: Once | INTRAVENOUS | Status: AC
  Administered 2016-12-05: 300 mg via INTRAVENOUS
  Filled 2016-12-02: qty 15

## 2016-12-05 ENCOUNTER — Inpatient Hospital Stay: Payer: Medicare Other | Attending: Oncology

## 2016-12-05 ENCOUNTER — Inpatient Hospital Stay: Payer: Medicare Other

## 2016-12-05 VITALS — BP 113/74 | HR 82 | Temp 97.2°F | Resp 18

## 2016-12-05 DIAGNOSIS — Z79899 Other long term (current) drug therapy: Secondary | ICD-10-CM | POA: Insufficient documentation

## 2016-12-05 DIAGNOSIS — G35 Multiple sclerosis: Secondary | ICD-10-CM

## 2016-12-05 LAB — COMPREHENSIVE METABOLIC PANEL
ALBUMIN: 4.2 g/dL (ref 3.5–5.0)
ALT: 22 U/L (ref 17–63)
ANION GAP: 4 — AB (ref 5–15)
AST: 19 U/L (ref 15–41)
Alkaline Phosphatase: 96 U/L (ref 38–126)
BILIRUBIN TOTAL: 0.7 mg/dL (ref 0.3–1.2)
BUN: 14 mg/dL (ref 6–20)
CALCIUM: 9.2 mg/dL (ref 8.9–10.3)
CO2: 29 mmol/L (ref 22–32)
Chloride: 100 mmol/L — ABNORMAL LOW (ref 101–111)
Creatinine, Ser: 0.84 mg/dL (ref 0.61–1.24)
GFR calc Af Amer: >60 mL/min (ref >60)
GFR calc non Af Amer: >60 mL/min (ref >60)
GLUCOSE: 106 mg/dL — AB (ref 65–99)
Potassium: 4.5 mmol/L (ref 3.5–5.1)
Sodium: 133 mmol/L — ABNORMAL LOW (ref 135–145)
TOTAL PROTEIN: 7.7 g/dL (ref 6.5–8.1)

## 2016-12-05 LAB — CBC WITH DIFFERENTIAL/PLATELET
BASOS PCT: 1 %
Basophils Absolute: 0.1 10*3/uL (ref 0–0.1)
Eosinophils Absolute: 0.1 10*3/uL (ref 0–0.7)
Eosinophils Relative: 2 %
HEMATOCRIT: 42.8 % (ref 40.0–52.0)
HEMOGLOBIN: 14.6 g/dL (ref 13.0–18.0)
LYMPHS PCT: 39 %
Lymphs Abs: 2.6 10*3/uL (ref 1.0–3.6)
MCH: 32.6 pg (ref 26.0–34.0)
MCHC: 34.1 g/dL (ref 32.0–36.0)
MCV: 95.7 fL (ref 80.0–100.0)
MONO ABS: 0.9 10*3/uL (ref 0.2–1.0)
MONOS PCT: 14 %
NEUTROS ABS: 3 10*3/uL (ref 1.4–6.5)
NEUTROS PCT: 44 %
Platelets: 238 10*3/uL (ref 150–440)
RBC: 4.47 MIL/uL (ref 4.40–5.90)
RDW: 13 % (ref 11.5–14.5)
WBC: 6.8 10*3/uL (ref 3.8–10.6)

## 2016-12-05 NOTE — Patient Instructions (Signed)

## 2016-12-28 ENCOUNTER — Other Ambulatory Visit: Payer: Medicare Other

## 2016-12-28 ENCOUNTER — Ambulatory Visit: Payer: Medicare Other

## 2016-12-30 ENCOUNTER — Inpatient Hospital Stay: Payer: Medicare Other

## 2017-01-02 ENCOUNTER — Inpatient Hospital Stay: Payer: Medicare Other

## 2017-01-03 MED ORDER — ACETAMINOPHEN 500 MG PO TABS
1000.0000 mg | ORAL_TABLET | Freq: Once | ORAL | Status: AC
  Administered 2017-01-04: 1000 mg via ORAL
  Filled 2017-01-03: qty 2

## 2017-01-03 MED ORDER — SODIUM CHLORIDE 0.9 % IV SOLN
Freq: Once | INTRAVENOUS | Status: AC
  Administered 2017-01-04: 14:00:00 via INTRAVENOUS
  Filled 2017-01-03: qty 1000

## 2017-01-03 MED ORDER — SODIUM CHLORIDE 0.9 % IV SOLN
300.0000 mg | Freq: Once | INTRAVENOUS | Status: AC
  Administered 2017-01-04: 300 mg via INTRAVENOUS
  Filled 2017-01-03: qty 15

## 2017-01-04 ENCOUNTER — Inpatient Hospital Stay: Payer: Medicare Other | Attending: Oncology

## 2017-01-04 ENCOUNTER — Other Ambulatory Visit: Payer: Self-pay

## 2017-01-04 ENCOUNTER — Inpatient Hospital Stay: Payer: Medicare Other

## 2017-01-04 VITALS — BP 138/73 | HR 70 | Temp 96.0°F | Resp 18

## 2017-01-04 DIAGNOSIS — G35 Multiple sclerosis: Secondary | ICD-10-CM | POA: Diagnosis present

## 2017-01-04 DIAGNOSIS — Z79899 Other long term (current) drug therapy: Secondary | ICD-10-CM | POA: Insufficient documentation

## 2017-01-04 LAB — CBC WITH DIFFERENTIAL/PLATELET
BASOS ABS: 0.1 10*3/uL (ref 0–0.1)
Basophils Relative: 1 %
EOS PCT: 2 %
Eosinophils Absolute: 0.1 10*3/uL (ref 0–0.7)
HEMATOCRIT: 40.3 % (ref 40.0–52.0)
Hemoglobin: 13.8 g/dL (ref 13.0–18.0)
Lymphocytes Relative: 41 %
Lymphs Abs: 3.6 10*3/uL (ref 1.0–3.6)
MCH: 32.8 pg (ref 26.0–34.0)
MCHC: 34.3 g/dL (ref 32.0–36.0)
MCV: 95.7 fL (ref 80.0–100.0)
MONO ABS: 0.9 10*3/uL (ref 0.2–1.0)
MONOS PCT: 10 %
NEUTROS ABS: 4 10*3/uL (ref 1.4–6.5)
Neutrophils Relative %: 46 %
PLATELETS: 243 10*3/uL (ref 150–440)
RBC: 4.21 MIL/uL — ABNORMAL LOW (ref 4.40–5.90)
RDW: 13 % (ref 11.5–14.5)
WBC: 8.7 10*3/uL (ref 3.8–10.6)

## 2017-01-04 LAB — COMPREHENSIVE METABOLIC PANEL
ALBUMIN: 4.1 g/dL (ref 3.5–5.0)
ALT: 26 U/L (ref 17–63)
ANION GAP: 6 (ref 5–15)
AST: 20 U/L (ref 15–41)
Alkaline Phosphatase: 92 U/L (ref 38–126)
BILIRUBIN TOTAL: 0.6 mg/dL (ref 0.3–1.2)
BUN: 14 mg/dL (ref 6–20)
CHLORIDE: 100 mmol/L — AB (ref 101–111)
CO2: 28 mmol/L (ref 22–32)
Calcium: 9.2 mg/dL (ref 8.9–10.3)
Creatinine, Ser: 0.84 mg/dL (ref 0.61–1.24)
GFR calc Af Amer: >60 mL/min (ref >60)
Glucose, Bld: 107 mg/dL — ABNORMAL HIGH (ref 65–99)
POTASSIUM: 4.1 mmol/L (ref 3.5–5.1)
Sodium: 134 mmol/L — ABNORMAL LOW (ref 135–145)
TOTAL PROTEIN: 7.4 g/dL (ref 6.5–8.1)

## 2017-01-27 ENCOUNTER — Inpatient Hospital Stay: Payer: Medicare Other

## 2017-01-30 ENCOUNTER — Inpatient Hospital Stay: Payer: Medicare Other

## 2017-01-31 ENCOUNTER — Other Ambulatory Visit: Payer: Self-pay

## 2017-01-31 ENCOUNTER — Inpatient Hospital Stay: Payer: Medicare Other

## 2017-01-31 ENCOUNTER — Inpatient Hospital Stay: Payer: Medicare Other | Attending: Oncology

## 2017-01-31 VITALS — BP 110/74 | HR 69 | Temp 96.8°F | Resp 18

## 2017-01-31 DIAGNOSIS — G35 Multiple sclerosis: Secondary | ICD-10-CM

## 2017-01-31 DIAGNOSIS — Z79899 Other long term (current) drug therapy: Secondary | ICD-10-CM | POA: Insufficient documentation

## 2017-01-31 LAB — COMPREHENSIVE METABOLIC PANEL
ALBUMIN: 4.3 g/dL (ref 3.5–5.0)
ALK PHOS: 93 U/L (ref 38–126)
ALT: 35 U/L (ref 17–63)
ANION GAP: 5 (ref 5–15)
AST: 32 U/L (ref 15–41)
BUN: 17 mg/dL (ref 6–20)
CO2: 28 mmol/L (ref 22–32)
Calcium: 9 mg/dL (ref 8.9–10.3)
Chloride: 99 mmol/L — ABNORMAL LOW (ref 101–111)
Creatinine, Ser: 0.92 mg/dL (ref 0.61–1.24)
GFR calc non Af Amer: >60 mL/min (ref >60)
GLUCOSE: 109 mg/dL — AB (ref 65–99)
POTASSIUM: 4.1 mmol/L (ref 3.5–5.1)
SODIUM: 132 mmol/L — AB (ref 135–145)
Total Bilirubin: 0.7 mg/dL (ref 0.3–1.2)
Total Protein: 7.7 g/dL (ref 6.5–8.1)

## 2017-01-31 LAB — CBC WITH DIFFERENTIAL/PLATELET
BASOS PCT: 1 %
Basophils Absolute: 0 10*3/uL (ref 0–0.1)
EOS ABS: 0.1 10*3/uL (ref 0–0.7)
EOS PCT: 1 %
HCT: 40.2 % (ref 40.0–52.0)
HEMOGLOBIN: 14 g/dL (ref 13.0–18.0)
LYMPHS ABS: 3 10*3/uL (ref 1.0–3.6)
Lymphocytes Relative: 40 %
MCH: 33.3 pg (ref 26.0–34.0)
MCHC: 34.8 g/dL (ref 32.0–36.0)
MCV: 95.5 fL (ref 80.0–100.0)
MONO ABS: 0.9 10*3/uL (ref 0.2–1.0)
MONOS PCT: 12 %
Neutro Abs: 3.4 10*3/uL (ref 1.4–6.5)
Neutrophils Relative %: 46 %
Platelets: 265 10*3/uL (ref 150–440)
RBC: 4.21 MIL/uL — ABNORMAL LOW (ref 4.40–5.90)
RDW: 12.8 % (ref 11.5–14.5)
WBC: 7.4 10*3/uL (ref 3.8–10.6)

## 2017-01-31 MED ORDER — SODIUM CHLORIDE 0.9 % IV SOLN
Freq: Once | INTRAVENOUS | Status: AC
  Administered 2017-01-31: 10:00:00 via INTRAVENOUS
  Filled 2017-01-31: qty 1000

## 2017-01-31 MED ORDER — SODIUM CHLORIDE 0.9 % IV SOLN
300.0000 mg | Freq: Once | INTRAVENOUS | Status: AC
  Administered 2017-01-31: 300 mg via INTRAVENOUS
  Filled 2017-01-31: qty 15

## 2017-01-31 MED ORDER — ACETAMINOPHEN 500 MG PO TABS
1000.0000 mg | ORAL_TABLET | Freq: Once | ORAL | Status: AC
  Administered 2017-01-31: 1000 mg via ORAL
  Filled 2017-01-31: qty 2

## 2017-02-23 MED ORDER — SODIUM CHLORIDE 0.9 % IV SOLN
300.0000 mg | Freq: Once | INTRAVENOUS | Status: AC
  Administered 2017-02-27: 300 mg via INTRAVENOUS
  Filled 2017-02-23: qty 15

## 2017-02-24 ENCOUNTER — Inpatient Hospital Stay: Payer: Medicare Other

## 2017-02-27 ENCOUNTER — Other Ambulatory Visit: Payer: Self-pay | Admitting: *Deleted

## 2017-02-27 ENCOUNTER — Inpatient Hospital Stay: Payer: Medicare Other | Attending: Oncology

## 2017-02-27 ENCOUNTER — Inpatient Hospital Stay: Payer: Medicare Other

## 2017-02-27 VITALS — BP 107/71 | HR 80 | Resp 16

## 2017-02-27 DIAGNOSIS — G35 Multiple sclerosis: Secondary | ICD-10-CM

## 2017-02-27 DIAGNOSIS — Z79899 Other long term (current) drug therapy: Secondary | ICD-10-CM | POA: Diagnosis not present

## 2017-02-27 LAB — COMPREHENSIVE METABOLIC PANEL
ALT: 25 U/L (ref 17–63)
AST: 21 U/L (ref 15–41)
Albumin: 4.3 g/dL (ref 3.5–5.0)
Alkaline Phosphatase: 89 U/L (ref 38–126)
Anion gap: 8 (ref 5–15)
BUN: 16 mg/dL (ref 6–20)
CO2: 27 mmol/L (ref 22–32)
CREATININE: 0.94 mg/dL (ref 0.61–1.24)
Calcium: 9.1 mg/dL (ref 8.9–10.3)
Chloride: 99 mmol/L — ABNORMAL LOW (ref 101–111)
GFR calc Af Amer: >60 mL/min (ref >60)
GFR calc non Af Amer: >60 mL/min (ref >60)
Glucose, Bld: 111 mg/dL — ABNORMAL HIGH (ref 65–99)
POTASSIUM: 4.3 mmol/L (ref 3.5–5.1)
Sodium: 134 mmol/L — ABNORMAL LOW (ref 135–145)
Total Bilirubin: 0.8 mg/dL (ref 0.3–1.2)
Total Protein: 7.9 g/dL (ref 6.5–8.1)

## 2017-02-27 LAB — CBC WITH DIFFERENTIAL/PLATELET
BASOS ABS: 0 10*3/uL (ref 0–0.1)
Basophils Relative: 0 %
Eosinophils Absolute: 0.1 10*3/uL (ref 0–0.7)
Eosinophils Relative: 2 %
HEMATOCRIT: 40.7 % (ref 40.0–52.0)
Hemoglobin: 13.9 g/dL (ref 13.0–18.0)
LYMPHS PCT: 42 %
Lymphs Abs: 3 10*3/uL (ref 1.0–3.6)
MCH: 32.9 pg (ref 26.0–34.0)
MCHC: 34.1 g/dL (ref 32.0–36.0)
MCV: 96.4 fL (ref 80.0–100.0)
MONO ABS: 0.9 10*3/uL (ref 0.2–1.0)
MONOS PCT: 12 %
NEUTROS ABS: 3.2 10*3/uL (ref 1.4–6.5)
Neutrophils Relative %: 44 %
Platelets: 258 10*3/uL (ref 150–440)
RBC: 4.23 MIL/uL — ABNORMAL LOW (ref 4.40–5.90)
RDW: 12.7 % (ref 11.5–14.5)
WBC: 7.2 10*3/uL (ref 3.8–10.6)

## 2017-02-27 MED ORDER — SODIUM CHLORIDE 0.9 % IV SOLN
Freq: Once | INTRAVENOUS | Status: AC
  Administered 2017-02-27: 10:00:00 via INTRAVENOUS
  Filled 2017-02-27: qty 1000

## 2017-02-27 MED ORDER — ACETAMINOPHEN 500 MG PO TABS
1000.0000 mg | ORAL_TABLET | Freq: Once | ORAL | Status: AC
  Administered 2017-02-27: 1000 mg via ORAL

## 2017-02-27 NOTE — Patient Instructions (Signed)

## 2017-03-24 ENCOUNTER — Inpatient Hospital Stay: Payer: Medicare Other

## 2017-03-27 ENCOUNTER — Inpatient Hospital Stay: Payer: Medicare Other | Attending: Oncology

## 2017-03-27 ENCOUNTER — Inpatient Hospital Stay: Payer: Medicare Other

## 2017-03-27 ENCOUNTER — Other Ambulatory Visit: Payer: Self-pay

## 2017-03-27 VITALS — BP 107/70 | HR 82 | Resp 18

## 2017-03-27 DIAGNOSIS — G35 Multiple sclerosis: Secondary | ICD-10-CM

## 2017-03-27 DIAGNOSIS — Z79899 Other long term (current) drug therapy: Secondary | ICD-10-CM | POA: Insufficient documentation

## 2017-03-27 LAB — COMPREHENSIVE METABOLIC PANEL
ALT: 27 U/L (ref 17–63)
AST: 20 U/L (ref 15–41)
Albumin: 4.2 g/dL (ref 3.5–5.0)
Alkaline Phosphatase: 96 U/L (ref 38–126)
Anion gap: 4 — ABNORMAL LOW (ref 5–15)
BILIRUBIN TOTAL: 0.8 mg/dL (ref 0.3–1.2)
BUN: 17 mg/dL (ref 6–20)
CALCIUM: 9.3 mg/dL (ref 8.9–10.3)
CHLORIDE: 100 mmol/L — AB (ref 101–111)
CO2: 29 mmol/L (ref 22–32)
CREATININE: 0.94 mg/dL (ref 0.61–1.24)
Glucose, Bld: 118 mg/dL — ABNORMAL HIGH (ref 65–99)
Potassium: 4.3 mmol/L (ref 3.5–5.1)
Sodium: 133 mmol/L — ABNORMAL LOW (ref 135–145)
TOTAL PROTEIN: 7.5 g/dL (ref 6.5–8.1)

## 2017-03-27 LAB — CBC WITH DIFFERENTIAL/PLATELET
Basophils Absolute: 0 10*3/uL (ref 0–0.1)
Basophils Relative: 0 %
EOS PCT: 2 %
Eosinophils Absolute: 0.1 10*3/uL (ref 0–0.7)
HEMATOCRIT: 39.9 % — AB (ref 40.0–52.0)
Hemoglobin: 14 g/dL (ref 13.0–18.0)
LYMPHS ABS: 3.1 10*3/uL (ref 1.0–3.6)
LYMPHS PCT: 39 %
MCH: 33.6 pg (ref 26.0–34.0)
MCHC: 35 g/dL (ref 32.0–36.0)
MCV: 96 fL (ref 80.0–100.0)
MONO ABS: 0.9 10*3/uL (ref 0.2–1.0)
Monocytes Relative: 11 %
NEUTROS ABS: 3.8 10*3/uL (ref 1.4–6.5)
Neutrophils Relative %: 48 %
PLATELETS: 256 10*3/uL (ref 150–440)
RBC: 4.16 MIL/uL — AB (ref 4.40–5.90)
RDW: 13 % (ref 11.5–14.5)
WBC: 8 10*3/uL (ref 3.8–10.6)

## 2017-03-27 MED ORDER — ACETAMINOPHEN 500 MG PO TABS
1000.0000 mg | ORAL_TABLET | Freq: Once | ORAL | Status: AC
  Administered 2017-03-27: 1000 mg via ORAL
  Filled 2017-03-27: qty 2

## 2017-03-27 MED ORDER — SODIUM CHLORIDE 0.9 % IV SOLN
300.0000 mg | Freq: Once | INTRAVENOUS | Status: AC
  Administered 2017-03-27: 300 mg via INTRAVENOUS
  Filled 2017-03-27: qty 15

## 2017-03-27 MED ORDER — SODIUM CHLORIDE 0.9 % IV SOLN
Freq: Once | INTRAVENOUS | Status: AC
  Administered 2017-03-27: 10:00:00 via INTRAVENOUS
  Filled 2017-03-27: qty 1000

## 2017-03-27 NOTE — Patient Instructions (Signed)

## 2017-03-30 ENCOUNTER — Inpatient Hospital Stay: Payer: Medicare Other

## 2017-04-26 NOTE — Progress Notes (Signed)
[facility]  Telephone:(336) [phone] Fax:(336) [phone]  ID: Jerry Bautista OB: 01-23-1976  MR#: [id number]  LAG#:[id number]  Dr. Meredeth Ide with [facility], [place], [place]  CHIEF COMPLAINT: Tysabri infusion for multiple sclerosis.   INTERVAL HISTORY: Patient returns to clinic for routine 6 month evaluation and continuation of Tysabri infusions. He reports his JC virus is negative. He currently feels well and is asymptomatic. He denies any recent fevers or illnesses. He has no new neurologic complaints. He has a good appetite and denies weight loss. Patient feels at his baseline and offers no specific complaints today.  REVIEW OF SYSTEMS:   Review of Systems  Constitutional: Negative for fever, malaise/fatigue and weight loss.  Respiratory: Negative.  Negative for cough and shortness of breath.   Cardiovascular: Negative.  Negative for chest pain and leg swelling.  Gastrointestinal: Negative.  Negative for abdominal pain.  Genitourinary: Negative.   Musculoskeletal: Negative.   Skin: Negative.  Negative for rash.  Neurological: Negative.  Negative for sensory change and weakness.  Psychiatric/Behavioral: Negative.  The patient is not nervous/anxious.     As per HPI. Otherwise, a complete review of systems is negative.  PAST MEDICAL HISTORY: Multiple sclerosis, hypertension.  PAST SURGICAL HISTORY: No past surgical history on file.  FAMILY HISTORY: Reviewed and unchanged. No report of chronic disease.      ADVANCED DIRECTIVES:    HEALTH MAINTENANCE: Social History  Substance Use Topics  . Smoking status: Not on file  . Smokeless tobacco: Not on file  . Alcohol use Not on file     Colonoscopy:  PAP:  Bone density:  Lipid panel:  No Known Allergies  Current Outpatient Prescriptions  Medication Sig Dispense Refill  . dalfampridine (AMPYRA) 10 MG TB12 Take 10 mg by mouth 2 (two) times daily.    . ferrous fumarate (HEMOCYTE - 106 MG FE) 325 (106  FE) MG TABS tablet Take 1 tablet by mouth.    Marland Kitchen lisinopril-hydrochlorothiazide (PRINZIDE,ZESTORETIC) 20-25 MG per tablet Take 1 tablet by mouth daily.    . Multiple Vitamin (MULTIVITAMIN) tablet Take 1 tablet by mouth daily.    . Omega-3 Fatty Acids (FISH OIL) 1000 MG CAPS Take 1 capsule by mouth daily.     No current facility-administered medications for this visit.     OBJECTIVE:  Vitals:   04/28/17 0915  BP: 116/74  Pulse: 87  Resp: 18  Temp: (!) 97.5 F (36.4 C)   ECOG FS:0 - Asymptomatic  General: Well-developed, well-nourished, no acute distress. Eyes: Pink conjunctiva, anicteric sclera. Lungs: Clear to auscultation bilaterally. Heart: Regular rate and rhythm. No rubs, murmurs, or gallops. Abdomen: Soft, nontender, nondistended. No organomegaly noted, normoactive bowel sounds. Musculoskeletal: No edema, cyanosis, or clubbing. Neuro: Alert, answering all questions appropriately. Cranial nerves grossly intact. Skin: No rashes or petechiae noted. Psych: Normal affect.    LAB RESULTS:  Lab Results  Component Value Date   NA 136 04/28/2017   K 4.1 04/28/2017   CL 102 04/28/2017   CO2 27 04/28/2017   GLUCOSE 81 04/28/2017   BUN 21 (H) 04/28/2017   CREATININE 0.93 04/28/2017   CALCIUM 9.3 04/28/2017   PROT 7.8 04/28/2017   ALBUMIN 4.3 04/28/2017   AST 21 04/28/2017   ALT 22 04/28/2017   ALKPHOS 96 04/28/2017   BILITOT 0.8 04/28/2017   GFRNONAA >60 04/28/2017   GFRAA >60 04/28/2017    Lab Results  Component Value Date   WBC 8.9 04/28/2017   NEUTROABS 4.2 04/28/2017   HGB  14.0 04/28/2017   HCT 40.0 04/28/2017   MCV 95.6 04/28/2017   PLT 244 04/28/2017     STUDIES: No results found.  ASSESSMENT:  Tysabri infusion for multiple sclerosis.   PLAN:    1. Multiple sclerosis: Continue with 300 mg to Tysabri every 4 weeks as ordered by patient's primary neurologist. Patient expressed understanding that all laboratory work including his JC virus titers,  will be checked by his Neurologist. He also understands that any questions or concerns regarding Tysabri or his multiple sclerosis should be directed towards his Neurologist. Return to clinic every 4 weeks for Tysabri and then in 6 months for routine evaluation.  Approximately 30 minutes spent in discussion of which greater than 50% was consultation.  Patient expressed understanding and was in agreement with this plan. He also understands that He can call clinic at any time with any questions, concerns, or complaints.   Neurologist at [facility]:  Satira Sark, MD  Jeralyn Ruths, MD   04/28/2017 11:58 AM

## 2017-04-28 ENCOUNTER — Inpatient Hospital Stay: Payer: Medicare Other

## 2017-04-28 ENCOUNTER — Inpatient Hospital Stay: Payer: Medicare Other | Attending: Oncology | Admitting: Oncology

## 2017-04-28 ENCOUNTER — Other Ambulatory Visit: Payer: Self-pay

## 2017-04-28 VITALS — BP 110/71 | HR 69 | Resp 18

## 2017-04-28 VITALS — BP 116/74 | HR 87 | Temp 97.5°F | Resp 18 | Wt 193.1 lb

## 2017-04-28 DIAGNOSIS — G35 Multiple sclerosis: Secondary | ICD-10-CM

## 2017-04-28 DIAGNOSIS — I10 Essential (primary) hypertension: Secondary | ICD-10-CM | POA: Diagnosis not present

## 2017-04-28 DIAGNOSIS — Z79899 Other long term (current) drug therapy: Secondary | ICD-10-CM | POA: Diagnosis not present

## 2017-04-28 LAB — COMPREHENSIVE METABOLIC PANEL
ALBUMIN: 4.3 g/dL (ref 3.5–5.0)
ALK PHOS: 96 U/L (ref 38–126)
ALT: 22 U/L (ref 17–63)
AST: 21 U/L (ref 15–41)
Anion gap: 7 (ref 5–15)
BUN: 21 mg/dL — ABNORMAL HIGH (ref 6–20)
CALCIUM: 9.3 mg/dL (ref 8.9–10.3)
CHLORIDE: 102 mmol/L (ref 101–111)
CO2: 27 mmol/L (ref 22–32)
CREATININE: 0.93 mg/dL (ref 0.61–1.24)
GFR calc non Af Amer: >60 mL/min (ref >60)
GLUCOSE: 81 mg/dL (ref 65–99)
Potassium: 4.1 mmol/L (ref 3.5–5.1)
SODIUM: 136 mmol/L (ref 135–145)
Total Bilirubin: 0.8 mg/dL (ref 0.3–1.2)
Total Protein: 7.8 g/dL (ref 6.5–8.1)

## 2017-04-28 LAB — CBC WITH DIFFERENTIAL/PLATELET
BASOS ABS: 0 10*3/uL (ref 0–0.1)
BASOS PCT: 0 %
EOS ABS: 0.1 10*3/uL (ref 0–0.7)
EOS PCT: 2 %
HCT: 40 % (ref 40.0–52.0)
HEMOGLOBIN: 14 g/dL (ref 13.0–18.0)
Lymphocytes Relative: 39 %
Lymphs Abs: 3.5 10*3/uL (ref 1.0–3.6)
MCH: 33.5 pg (ref 26.0–34.0)
MCHC: 35 g/dL (ref 32.0–36.0)
MCV: 95.6 fL (ref 80.0–100.0)
Monocytes Absolute: 1 10*3/uL (ref 0.2–1.0)
Monocytes Relative: 12 %
NEUTROS PCT: 47 %
Neutro Abs: 4.2 10*3/uL (ref 1.4–6.5)
PLATELETS: 244 10*3/uL (ref 150–440)
RBC: 4.19 MIL/uL — AB (ref 4.40–5.90)
RDW: 13 % (ref 11.5–14.5)
WBC: 8.9 10*3/uL (ref 3.8–10.6)

## 2017-04-28 MED ORDER — SODIUM CHLORIDE 0.9 % IV SOLN
300.0000 mg | Freq: Once | INTRAVENOUS | Status: AC
  Administered 2017-04-28: 300 mg via INTRAVENOUS
  Filled 2017-04-28: qty 15

## 2017-04-28 MED ORDER — ACETAMINOPHEN 500 MG PO TABS
1000.0000 mg | ORAL_TABLET | Freq: Once | ORAL | Status: AC
  Administered 2017-04-28: 1000 mg via ORAL
  Filled 2017-04-28: qty 2

## 2017-04-28 MED ORDER — SODIUM CHLORIDE 0.9 % IV SOLN
Freq: Once | INTRAVENOUS | Status: AC
  Administered 2017-04-28: 10:00:00 via INTRAVENOUS
  Filled 2017-04-28: qty 1000

## 2017-04-28 NOTE — Patient Instructions (Signed)

## 2017-04-28 NOTE — Progress Notes (Signed)
Patient denies any concerns today.  

## 2017-05-26 ENCOUNTER — Inpatient Hospital Stay: Payer: Medicare Other

## 2017-05-26 ENCOUNTER — Inpatient Hospital Stay: Payer: Medicare Other | Attending: Oncology

## 2017-05-26 VITALS — BP 106/72 | HR 72 | Temp 96.4°F | Resp 18

## 2017-05-26 DIAGNOSIS — G35 Multiple sclerosis: Secondary | ICD-10-CM | POA: Insufficient documentation

## 2017-05-26 DIAGNOSIS — Z79899 Other long term (current) drug therapy: Secondary | ICD-10-CM | POA: Diagnosis not present

## 2017-05-26 LAB — CBC WITH DIFFERENTIAL/PLATELET
BASOS ABS: 0.1 10*3/uL (ref 0–0.1)
BASOS PCT: 1 %
EOS ABS: 0.1 10*3/uL (ref 0–0.7)
EOS PCT: 2 %
HCT: 41.1 % (ref 40.0–52.0)
Hemoglobin: 14.2 g/dL (ref 13.0–18.0)
Lymphocytes Relative: 42 %
Lymphs Abs: 3.2 10*3/uL (ref 1.0–3.6)
MCH: 33.3 pg (ref 26.0–34.0)
MCHC: 34.7 g/dL (ref 32.0–36.0)
MCV: 96.2 fL (ref 80.0–100.0)
MONO ABS: 0.8 10*3/uL (ref 0.2–1.0)
Monocytes Relative: 10 %
Neutro Abs: 3.6 10*3/uL (ref 1.4–6.5)
Neutrophils Relative %: 45 %
PLATELETS: 248 10*3/uL (ref 150–440)
RBC: 4.27 MIL/uL — AB (ref 4.40–5.90)
RDW: 12.7 % (ref 11.5–14.5)
WBC: 7.7 10*3/uL (ref 3.8–10.6)

## 2017-05-26 LAB — COMPREHENSIVE METABOLIC PANEL
ALT: 23 U/L (ref 17–63)
AST: 21 U/L (ref 15–41)
Albumin: 4.4 g/dL (ref 3.5–5.0)
Alkaline Phosphatase: 97 U/L (ref 38–126)
Anion gap: 6 (ref 5–15)
BUN: 20 mg/dL (ref 6–20)
CO2: 27 mmol/L (ref 22–32)
CREATININE: 0.83 mg/dL (ref 0.61–1.24)
Calcium: 9.1 mg/dL (ref 8.9–10.3)
Chloride: 102 mmol/L (ref 101–111)
GFR calc Af Amer: >60 mL/min (ref >60)
Glucose, Bld: 85 mg/dL (ref 65–99)
POTASSIUM: 3.8 mmol/L (ref 3.5–5.1)
SODIUM: 135 mmol/L (ref 135–145)
Total Bilirubin: 0.7 mg/dL (ref 0.3–1.2)
Total Protein: 7.5 g/dL (ref 6.5–8.1)

## 2017-05-26 MED ORDER — SODIUM CHLORIDE 0.9 % IV SOLN
Freq: Once | INTRAVENOUS | Status: AC
  Administered 2017-05-26: 09:00:00 via INTRAVENOUS
  Filled 2017-05-26: qty 1000

## 2017-05-26 MED ORDER — SODIUM CHLORIDE 0.9 % IV SOLN
300.0000 mg | Freq: Once | INTRAVENOUS | Status: AC
  Administered 2017-05-26: 300 mg via INTRAVENOUS
  Filled 2017-05-26: qty 15

## 2017-05-26 MED ORDER — ACETAMINOPHEN 500 MG PO TABS
1000.0000 mg | ORAL_TABLET | Freq: Once | ORAL | Status: AC
  Administered 2017-05-26: 1000 mg via ORAL
  Filled 2017-05-26: qty 2

## 2017-06-23 ENCOUNTER — Inpatient Hospital Stay: Payer: Medicare Other

## 2017-06-26 ENCOUNTER — Inpatient Hospital Stay: Payer: Medicare Other

## 2017-06-29 MED ORDER — SODIUM CHLORIDE 0.9 % IV SOLN
300.0000 mg | Freq: Once | INTRAVENOUS | Status: AC
  Administered 2017-06-30: 300 mg via INTRAVENOUS
  Filled 2017-06-29: qty 15

## 2017-06-29 MED ORDER — SODIUM CHLORIDE 0.9 % IV SOLN
30.0000 mL/h | Freq: Once | INTRAVENOUS | Status: AC
  Administered 2017-06-30: 30 mL/h via INTRAVENOUS
  Filled 2017-06-29: qty 1000

## 2017-06-29 MED ORDER — ACETAMINOPHEN 500 MG PO TABS
1000.0000 mg | ORAL_TABLET | Freq: Once | ORAL | Status: AC
  Administered 2017-06-30: 1000 mg via ORAL

## 2017-06-30 ENCOUNTER — Inpatient Hospital Stay: Payer: Medicare Other | Attending: Oncology

## 2017-06-30 ENCOUNTER — Inpatient Hospital Stay: Payer: Medicare Other

## 2017-06-30 VITALS — BP 109/78 | HR 73 | Temp 97.4°F | Resp 18

## 2017-06-30 DIAGNOSIS — Z79899 Other long term (current) drug therapy: Secondary | ICD-10-CM | POA: Diagnosis not present

## 2017-06-30 DIAGNOSIS — G35 Multiple sclerosis: Secondary | ICD-10-CM | POA: Diagnosis not present

## 2017-06-30 LAB — COMPREHENSIVE METABOLIC PANEL
ALBUMIN: 4.4 g/dL (ref 3.5–5.0)
ALK PHOS: 96 U/L (ref 38–126)
ALT: 26 U/L (ref 17–63)
ANION GAP: 6 (ref 5–15)
AST: 22 U/L (ref 15–41)
BUN: 19 mg/dL (ref 6–20)
CALCIUM: 9 mg/dL (ref 8.9–10.3)
CO2: 26 mmol/L (ref 22–32)
Chloride: 102 mmol/L (ref 101–111)
Creatinine, Ser: 0.93 mg/dL (ref 0.61–1.24)
GFR calc Af Amer: >60 mL/min (ref >60)
GFR calc non Af Amer: >60 mL/min (ref >60)
GLUCOSE: 136 mg/dL — AB (ref 65–99)
Potassium: 4.3 mmol/L (ref 3.5–5.1)
SODIUM: 134 mmol/L — AB (ref 135–145)
Total Bilirubin: 1 mg/dL (ref 0.3–1.2)
Total Protein: 8 g/dL (ref 6.5–8.1)

## 2017-06-30 LAB — CBC WITH DIFFERENTIAL/PLATELET
BASOS PCT: 0 %
Basophils Absolute: 0 10*3/uL (ref 0–0.1)
Eosinophils Absolute: 0.1 10*3/uL (ref 0–0.7)
Eosinophils Relative: 1 %
HEMATOCRIT: 42.2 % (ref 40.0–52.0)
Hemoglobin: 14.5 g/dL (ref 13.0–18.0)
Lymphocytes Relative: 40 %
Lymphs Abs: 3.3 10*3/uL (ref 1.0–3.6)
MCH: 33 pg (ref 26.0–34.0)
MCHC: 34.3 g/dL (ref 32.0–36.0)
MCV: 96.4 fL (ref 80.0–100.0)
MONO ABS: 0.9 10*3/uL (ref 0.2–1.0)
MONOS PCT: 10 %
NEUTROS ABS: 3.9 10*3/uL (ref 1.4–6.5)
Neutrophils Relative %: 49 %
Platelets: 241 10*3/uL (ref 150–440)
RBC: 4.38 MIL/uL — ABNORMAL LOW (ref 4.40–5.90)
RDW: 12.9 % (ref 11.5–14.5)
WBC: 8.1 10*3/uL (ref 3.8–10.6)

## 2017-06-30 MED ORDER — SODIUM CHLORIDE 0.9 % IV SOLN
Freq: Once | INTRAVENOUS | Status: DC
  Filled 2017-06-30: qty 1000

## 2017-06-30 MED ORDER — ACETAMINOPHEN 500 MG PO TABS
ORAL_TABLET | ORAL | Status: AC
  Filled 2017-06-30: qty 2

## 2017-06-30 NOTE — Patient Instructions (Signed)

## 2017-07-21 ENCOUNTER — Inpatient Hospital Stay: Payer: Medicare Other

## 2017-07-27 MED ORDER — SODIUM CHLORIDE 0.9 % IV SOLN
300.0000 mg | Freq: Once | INTRAVENOUS | Status: AC
  Administered 2017-07-28: 300 mg via INTRAVENOUS
  Filled 2017-07-27: qty 15

## 2017-07-28 ENCOUNTER — Inpatient Hospital Stay: Payer: Medicare Other | Attending: Oncology

## 2017-07-28 ENCOUNTER — Inpatient Hospital Stay: Payer: Medicare Other

## 2017-07-28 VITALS — BP 107/73 | HR 76 | Temp 97.3°F | Resp 18

## 2017-07-28 DIAGNOSIS — G35 Multiple sclerosis: Secondary | ICD-10-CM | POA: Diagnosis not present

## 2017-07-28 DIAGNOSIS — Z79899 Other long term (current) drug therapy: Secondary | ICD-10-CM | POA: Diagnosis not present

## 2017-07-28 LAB — CBC WITH DIFFERENTIAL/PLATELET
Basophils Absolute: 0.1 10*3/uL (ref 0–0.1)
Basophils Relative: 1 %
EOS ABS: 0.1 10*3/uL (ref 0–0.7)
Eosinophils Relative: 2 %
HEMATOCRIT: 41.8 % (ref 40.0–52.0)
HEMOGLOBIN: 14.2 g/dL (ref 13.0–18.0)
LYMPHS ABS: 3.1 10*3/uL (ref 1.0–3.6)
Lymphocytes Relative: 41 %
MCH: 33 pg (ref 26.0–34.0)
MCHC: 34 g/dL (ref 32.0–36.0)
MCV: 97 fL (ref 80.0–100.0)
MONO ABS: 0.9 10*3/uL (ref 0.2–1.0)
MONOS PCT: 11 %
NEUTROS PCT: 45 %
Neutro Abs: 3.4 10*3/uL (ref 1.4–6.5)
Platelets: 232 10*3/uL (ref 150–440)
RBC: 4.31 MIL/uL — ABNORMAL LOW (ref 4.40–5.90)
RDW: 13.3 % (ref 11.5–14.5)
WBC: 7.7 10*3/uL (ref 3.8–10.6)

## 2017-07-28 LAB — COMPREHENSIVE METABOLIC PANEL WITH GFR
ALT: 26 U/L (ref 17–63)
AST: 21 U/L (ref 15–41)
Albumin: 4.3 g/dL (ref 3.5–5.0)
Alkaline Phosphatase: 94 U/L (ref 38–126)
Anion gap: 7 (ref 5–15)
BUN: 19 mg/dL (ref 6–20)
CO2: 27 mmol/L (ref 22–32)
Calcium: 9.1 mg/dL (ref 8.9–10.3)
Chloride: 101 mmol/L (ref 101–111)
Creatinine, Ser: 0.83 mg/dL (ref 0.61–1.24)
GFR calc Af Amer: >60 mL/min (ref >60)
GFR calc non Af Amer: >60 mL/min (ref >60)
Glucose, Bld: 107 mg/dL — ABNORMAL HIGH (ref 65–99)
Potassium: 4.1 mmol/L (ref 3.5–5.1)
Sodium: 135 mmol/L (ref 135–145)
Total Bilirubin: 0.8 mg/dL (ref 0.3–1.2)
Total Protein: 7.8 g/dL (ref 6.5–8.1)

## 2017-07-28 MED ORDER — ACETAMINOPHEN 500 MG PO TABS
1000.0000 mg | ORAL_TABLET | Freq: Once | ORAL | Status: AC
  Administered 2017-07-28: 1000 mg via ORAL
  Filled 2017-07-28: qty 2

## 2017-07-28 MED ORDER — SODIUM CHLORIDE 0.9 % IV SOLN
Freq: Once | INTRAVENOUS | Status: AC
  Administered 2017-07-28: 10:00:00 via INTRAVENOUS
  Filled 2017-07-28: qty 1000

## 2017-08-18 ENCOUNTER — Inpatient Hospital Stay: Payer: Medicare Other

## 2017-08-24 MED ORDER — ACETAMINOPHEN 500 MG PO TABS
1000.0000 mg | ORAL_TABLET | Freq: Once | ORAL | Status: AC
  Administered 2017-08-25: 1000 mg via ORAL

## 2017-08-24 MED ORDER — SODIUM CHLORIDE 0.9 % IV SOLN
300.0000 mg | Freq: Once | INTRAVENOUS | Status: AC
  Administered 2017-08-25: 300 mg via INTRAVENOUS
  Filled 2017-08-24: qty 15

## 2017-08-24 MED ORDER — SODIUM CHLORIDE 0.9 % IV SOLN
Freq: Once | INTRAVENOUS | Status: AC
  Administered 2017-08-25: 10:00:00 via INTRAVENOUS
  Filled 2017-08-24: qty 1000

## 2017-08-25 ENCOUNTER — Inpatient Hospital Stay: Payer: Medicare Other

## 2017-08-25 ENCOUNTER — Inpatient Hospital Stay: Payer: Medicare Other | Attending: Oncology

## 2017-08-25 VITALS — BP 113/77 | HR 89 | Temp 96.7°F | Resp 18

## 2017-08-25 DIAGNOSIS — G35 Multiple sclerosis: Secondary | ICD-10-CM | POA: Diagnosis not present

## 2017-08-25 LAB — CBC WITH DIFFERENTIAL/PLATELET
BASOS ABS: 0 10*3/uL (ref 0–0.1)
BASOS PCT: 0 %
Eosinophils Absolute: 0.1 10*3/uL (ref 0–0.7)
Eosinophils Relative: 1 %
HEMATOCRIT: 38.9 % — AB (ref 40.0–52.0)
HEMOGLOBIN: 13.8 g/dL (ref 13.0–18.0)
LYMPHS PCT: 43 %
Lymphs Abs: 3.5 10*3/uL (ref 1.0–3.6)
MCH: 33.3 pg (ref 26.0–34.0)
MCHC: 35.3 g/dL (ref 32.0–36.0)
MCV: 94.3 fL (ref 80.0–100.0)
MONO ABS: 0.8 10*3/uL (ref 0.2–1.0)
MONOS PCT: 11 %
NEUTROS ABS: 3.5 10*3/uL (ref 1.4–6.5)
NEUTROS PCT: 45 %
Platelets: 227 10*3/uL (ref 150–440)
RBC: 4.13 MIL/uL — ABNORMAL LOW (ref 4.40–5.90)
RDW: 13 % (ref 11.5–14.5)
WBC: 8 10*3/uL (ref 3.8–10.6)

## 2017-08-25 LAB — COMPREHENSIVE METABOLIC PANEL
ALBUMIN: 4.3 g/dL (ref 3.5–5.0)
ALT: 24 U/L (ref 17–63)
ANION GAP: 8 (ref 5–15)
AST: 23 U/L (ref 15–41)
Alkaline Phosphatase: 90 U/L (ref 38–126)
BUN: 19 mg/dL (ref 6–20)
CO2: 24 mmol/L (ref 22–32)
Calcium: 8.7 mg/dL — ABNORMAL LOW (ref 8.9–10.3)
Chloride: 100 mmol/L — ABNORMAL LOW (ref 101–111)
Creatinine, Ser: 0.83 mg/dL (ref 0.61–1.24)
GFR calc non Af Amer: >60 mL/min (ref >60)
GLUCOSE: 145 mg/dL — AB (ref 65–99)
POTASSIUM: 3.7 mmol/L (ref 3.5–5.1)
SODIUM: 132 mmol/L — AB (ref 135–145)
Total Bilirubin: 0.9 mg/dL (ref 0.3–1.2)
Total Protein: 7.5 g/dL (ref 6.5–8.1)

## 2017-08-25 MED ORDER — ACETAMINOPHEN 500 MG PO TABS
ORAL_TABLET | ORAL | Status: AC
  Filled 2017-08-25: qty 2

## 2017-08-25 NOTE — Patient Instructions (Signed)

## 2017-09-15 ENCOUNTER — Inpatient Hospital Stay: Payer: Medicare Other

## 2017-09-22 ENCOUNTER — Inpatient Hospital Stay: Payer: Medicare Other

## 2017-09-22 ENCOUNTER — Inpatient Hospital Stay: Payer: Medicare Other | Attending: Oncology

## 2017-09-22 ENCOUNTER — Other Ambulatory Visit: Payer: Self-pay | Admitting: Oncology

## 2017-09-22 VITALS — BP 112/78 | HR 75 | Temp 97.2°F | Resp 18

## 2017-09-22 DIAGNOSIS — G35 Multiple sclerosis: Secondary | ICD-10-CM

## 2017-09-22 DIAGNOSIS — Z5112 Encounter for antineoplastic immunotherapy: Secondary | ICD-10-CM | POA: Diagnosis not present

## 2017-09-22 MED ORDER — SODIUM CHLORIDE 0.9 % IV SOLN
Freq: Once | INTRAVENOUS | Status: AC
  Administered 2017-09-22: 10:00:00 via INTRAVENOUS
  Filled 2017-09-22: qty 1000

## 2017-09-22 MED ORDER — SODIUM CHLORIDE 0.9 % IV SOLN
300.0000 mg | Freq: Once | INTRAVENOUS | Status: AC
  Administered 2017-09-22: 300 mg via INTRAVENOUS
  Filled 2017-09-22 (×2): qty 15

## 2017-09-22 MED ORDER — ACETAMINOPHEN 500 MG PO TABS
1000.0000 mg | ORAL_TABLET | Freq: Once | ORAL | Status: AC
  Administered 2017-09-22: 1000 mg via ORAL
  Filled 2017-09-22: qty 2

## 2017-10-13 ENCOUNTER — Other Ambulatory Visit: Payer: Medicare Other

## 2017-10-13 ENCOUNTER — Ambulatory Visit: Payer: Medicare Other

## 2017-10-13 ENCOUNTER — Ambulatory Visit: Payer: Medicare Other | Admitting: Oncology

## 2017-10-20 ENCOUNTER — Other Ambulatory Visit: Payer: Medicare Other

## 2017-10-20 ENCOUNTER — Ambulatory Visit: Payer: Medicare Other

## 2017-10-20 ENCOUNTER — Inpatient Hospital Stay: Payer: Medicare Other | Attending: Oncology

## 2017-10-20 VITALS — BP 117/74 | HR 71 | Temp 97.0°F | Resp 18

## 2017-10-20 DIAGNOSIS — Z5112 Encounter for antineoplastic immunotherapy: Secondary | ICD-10-CM | POA: Insufficient documentation

## 2017-10-20 DIAGNOSIS — G35 Multiple sclerosis: Secondary | ICD-10-CM | POA: Diagnosis present

## 2017-10-20 MED ORDER — NATALIZUMAB 300 MG/15ML IV CONC
300.0000 mg | Freq: Once | INTRAVENOUS | Status: AC
  Administered 2017-10-20: 300 mg via INTRAVENOUS
  Filled 2017-10-20: qty 15

## 2017-10-20 MED ORDER — ACETAMINOPHEN 500 MG PO TABS
1000.0000 mg | ORAL_TABLET | Freq: Once | ORAL | Status: AC
  Administered 2017-10-20: 1000 mg via ORAL
  Filled 2017-10-20: qty 2

## 2017-10-20 MED ORDER — SODIUM CHLORIDE 0.9 % IV SOLN
Freq: Once | INTRAVENOUS | Status: AC
  Administered 2017-10-20: 14:00:00 via INTRAVENOUS
  Filled 2017-10-20: qty 1000

## 2017-11-13 NOTE — Progress Notes (Signed)
[facility]  Telephone:([phone] Fax:(336) [phone]  ID: Jerry Bautista OB: 02-Feb-1976  MR#: [id number]  AOZ#:[id number]  [facility], [place], [place]  CHIEF COMPLAINT: Tysabri infusion for multiple sclerosis.   INTERVAL HISTORY: Patient returns to clinic today for routine six-month follow-up and continuation of Tysabri infusions.  Patient reports infusions are working well, though he does notice a mild increase in fatigue in the days leading up to his next transfusion.  He reports his JC virus continues to be negative. He currently feels well and is asymptomatic. He denies any recent fevers or illnesses. He has no new neurologic complaints. He has a good appetite and denies weight loss.  Patient offers no further specific complaints today.  REVIEW OF SYSTEMS:   Review of Systems  Constitutional: Negative.  Negative for fever, malaise/fatigue and weight loss.  Respiratory: Negative.  Negative for cough and shortness of breath.   Cardiovascular: Negative.  Negative for chest pain and leg swelling.  Gastrointestinal: Negative.  Negative for abdominal pain.  Genitourinary: Negative.   Musculoskeletal: Negative.   Skin: Negative.  Negative for rash.  Neurological: Negative.  Negative for sensory change, focal weakness and weakness.  Psychiatric/Behavioral: Negative.  The patient is not nervous/anxious.    As per HPI. Otherwise, a complete review of systems is negative.   PAST MEDICAL HISTORY: Multiple sclerosis, hypertension.  PAST SURGICAL HISTORY: Reviewed and unchanged.  FAMILY HISTORY: Reviewed and unchanged. No report of chronic disease.      ADVANCED DIRECTIVES:    HEALTH MAINTENANCE: Social History   Tobacco Use  . Smoking status: Not on file  Substance Use Topics  . Alcohol use: Not on file  . Drug use: Not on file     Colonoscopy:  PAP:  Bone density:  Lipid panel:  No Known Allergies  Current Outpatient Medications  Medication Sig  Dispense Refill  . dalfampridine (AMPYRA) 10 MG TB12 Take 10 mg by mouth 2 (two) times daily.    . ferrous fumarate (HEMOCYTE - 106 MG FE) 325 (106 FE) MG TABS tablet Take 1 tablet by mouth.    Marland Kitchen lisinopril-hydrochlorothiazide (PRINZIDE,ZESTORETIC) 20-25 MG per tablet Take 1 tablet by mouth daily.    . Multiple Vitamin (MULTIVITAMIN) tablet Take 1 tablet by mouth daily.    . Omega-3 Fatty Acids (FISH OIL) 1000 MG CAPS Take 1 capsule by mouth daily.     No current facility-administered medications for this visit.     OBJECTIVE:  Vitals:   11/17/17 0916  BP: 112/76  Pulse: 94  Resp: 20  Temp: 97.6 F (36.4 C)   ECOG FS:0 - Asymptomatic  General: Well-developed, well-nourished, no acute distress. Eyes: Pink conjunctiva, anicteric sclera. Lungs: Clear to auscultation bilaterally. Heart: Regular rate and rhythm. No rubs, murmurs, or gallops. Abdomen: Soft, nontender, nondistended. No organomegaly noted, normoactive bowel sounds. Musculoskeletal: No edema, cyanosis, or clubbing. Neuro: Alert, answering all questions appropriately. Cranial nerves grossly intact. Skin: No rashes or petechiae noted. Psych: Normal affect.   LAB RESULTS:  Lab Results  Component Value Date   NA 132 (L) 08/25/2017   K 3.7 08/25/2017   CL 100 (L) 08/25/2017   CO2 24 08/25/2017   GLUCOSE 145 (H) 08/25/2017   BUN 19 08/25/2017   CREATININE 0.83 08/25/2017   CALCIUM 8.7 (L) 08/25/2017   PROT 7.5 08/25/2017   ALBUMIN 4.3 08/25/2017   AST 23 08/25/2017   ALT 24 08/25/2017   ALKPHOS 90 08/25/2017   BILITOT 0.9 08/25/2017   GFRNONAA >60  08/25/2017   GFRAA >60 08/25/2017    Lab Results  Component Value Date   WBC 8.0 08/25/2017   NEUTROABS 3.5 08/25/2017   HGB 13.8 08/25/2017   HCT 38.9 (L) 08/25/2017   MCV 94.3 08/25/2017   PLT 227 08/25/2017     STUDIES: No results found.  ASSESSMENT:  Tysabri infusion for multiple sclerosis.   PLAN:    1. Multiple sclerosis: Proceed with 300 mg  IV Tysabri every 4 weeks patient reports Dr. Meredeth Ide has retired and he has a new neurologist, but cannot recall her name.  Patient expressed understanding that all laboratory work including his JC virus titers, will be checked by his Neurologist. He also understands that any questions or concerns regarding Tysabri or his multiple sclerosis should be directed towards his Neurologist.  Return to clinic every 4 weeks for his infusion and then in 6 months for further evaluation.  Approximately 30 minutes was spent in discussion of which greater than 50% consultation.  Patient expressed understanding and was in agreement with this plan. He also understands that He can call clinic at any time with any questions, concerns, or complaints.   Neurologist at [facility]:   Jeralyn Ruths, MD   11/17/2017 1:32 PM

## 2017-11-17 ENCOUNTER — Other Ambulatory Visit: Payer: Self-pay

## 2017-11-17 ENCOUNTER — Inpatient Hospital Stay: Payer: Medicare Other | Attending: Oncology | Admitting: Oncology

## 2017-11-17 ENCOUNTER — Other Ambulatory Visit: Payer: Medicare Other

## 2017-11-17 ENCOUNTER — Inpatient Hospital Stay: Payer: Medicare Other

## 2017-11-17 VITALS — BP 112/76 | HR 94 | Temp 97.6°F | Resp 20 | Ht 72.44 in | Wt 194.0 lb

## 2017-11-17 VITALS — BP 110/76 | HR 74 | Temp 97.8°F | Resp 20

## 2017-11-17 DIAGNOSIS — G35 Multiple sclerosis: Secondary | ICD-10-CM | POA: Diagnosis not present

## 2017-11-17 DIAGNOSIS — Z5112 Encounter for antineoplastic immunotherapy: Secondary | ICD-10-CM | POA: Insufficient documentation

## 2017-11-17 MED ORDER — SODIUM CHLORIDE 0.9 % IV SOLN
300.0000 mg | Freq: Once | INTRAVENOUS | Status: AC
  Administered 2017-11-17: 300 mg via INTRAVENOUS
  Filled 2017-11-17: qty 15

## 2017-11-17 MED ORDER — ACETAMINOPHEN 500 MG PO TABS
1000.0000 mg | ORAL_TABLET | Freq: Once | ORAL | Status: AC
  Administered 2017-11-17: 1000 mg via ORAL

## 2017-11-17 MED ORDER — ACETAMINOPHEN 500 MG PO TABS
ORAL_TABLET | ORAL | Status: AC
  Filled 2017-11-17: qty 2

## 2017-11-17 MED ORDER — SODIUM CHLORIDE 0.9 % IV SOLN
Freq: Once | INTRAVENOUS | Status: AC
  Administered 2017-11-17: 10:00:00 via INTRAVENOUS
  Filled 2017-11-17: qty 1000

## 2017-12-15 ENCOUNTER — Inpatient Hospital Stay: Payer: Medicare Other | Attending: Oncology

## 2017-12-15 VITALS — BP 117/78 | HR 65 | Temp 97.2°F | Resp 18

## 2017-12-15 DIAGNOSIS — G35 Multiple sclerosis: Secondary | ICD-10-CM | POA: Diagnosis not present

## 2017-12-15 DIAGNOSIS — Z79899 Other long term (current) drug therapy: Secondary | ICD-10-CM | POA: Diagnosis not present

## 2017-12-15 MED ORDER — SODIUM CHLORIDE 0.9 % IV SOLN
300.0000 mg | Freq: Once | INTRAVENOUS | Status: AC
  Administered 2017-12-15: 300 mg via INTRAVENOUS
  Filled 2017-12-15: qty 15

## 2017-12-15 MED ORDER — ACETAMINOPHEN 500 MG PO TABS
1000.0000 mg | ORAL_TABLET | Freq: Once | ORAL | Status: AC
  Administered 2017-12-15: 1000 mg via ORAL
  Filled 2017-12-15: qty 2

## 2017-12-15 MED ORDER — SODIUM CHLORIDE 0.9 % IV SOLN
Freq: Once | INTRAVENOUS | Status: AC
  Administered 2017-12-15: 10:00:00 via INTRAVENOUS
  Filled 2017-12-15: qty 1000

## 2018-01-12 ENCOUNTER — Inpatient Hospital Stay: Payer: Medicare Other | Attending: Oncology

## 2018-01-12 VITALS — BP 112/74 | HR 66 | Temp 96.9°F | Resp 18

## 2018-01-12 DIAGNOSIS — G35 Multiple sclerosis: Secondary | ICD-10-CM | POA: Diagnosis not present

## 2018-01-12 MED ORDER — ACETAMINOPHEN 500 MG PO TABS
1000.0000 mg | ORAL_TABLET | Freq: Once | ORAL | Status: AC
  Administered 2018-01-12: 1000 mg via ORAL
  Filled 2018-01-12: qty 2

## 2018-01-12 MED ORDER — SODIUM CHLORIDE 0.9 % IV SOLN
Freq: Once | INTRAVENOUS | Status: AC
  Administered 2018-01-12: 09:00:00 via INTRAVENOUS
  Filled 2018-01-12: qty 1000

## 2018-01-12 MED ORDER — SODIUM CHLORIDE 0.9 % IV SOLN
300.0000 mg | Freq: Once | INTRAVENOUS | Status: AC
  Administered 2018-01-12: 300 mg via INTRAVENOUS
  Filled 2018-01-12: qty 15

## 2018-02-09 ENCOUNTER — Inpatient Hospital Stay: Payer: Medicare Other

## 2018-02-12 ENCOUNTER — Inpatient Hospital Stay: Payer: Medicare Other | Attending: Oncology

## 2018-02-12 VITALS — BP 104/72 | HR 71 | Temp 96.7°F | Resp 18

## 2018-02-12 DIAGNOSIS — Z5112 Encounter for antineoplastic immunotherapy: Secondary | ICD-10-CM | POA: Diagnosis present

## 2018-02-12 DIAGNOSIS — G35 Multiple sclerosis: Secondary | ICD-10-CM | POA: Diagnosis present

## 2018-02-12 MED ORDER — SODIUM CHLORIDE 0.9 % IV SOLN
300.0000 mg | Freq: Once | INTRAVENOUS | Status: AC
  Administered 2018-02-12: 300 mg via INTRAVENOUS
  Filled 2018-02-12: qty 15

## 2018-02-12 MED ORDER — SODIUM CHLORIDE 0.9 % IV SOLN
Freq: Once | INTRAVENOUS | Status: AC
  Administered 2018-02-12: 10:00:00 via INTRAVENOUS
  Filled 2018-02-12: qty 1000

## 2018-02-12 MED ORDER — ACETAMINOPHEN 500 MG PO TABS
1000.0000 mg | ORAL_TABLET | Freq: Once | ORAL | Status: AC
  Administered 2018-02-12: 1000 mg via ORAL
  Filled 2018-02-12: qty 2

## 2018-02-12 NOTE — Patient Instructions (Signed)

## 2018-03-06 ENCOUNTER — Other Ambulatory Visit: Payer: Self-pay | Admitting: *Deleted

## 2018-03-06 DIAGNOSIS — G35 Multiple sclerosis: Secondary | ICD-10-CM

## 2018-03-09 ENCOUNTER — Inpatient Hospital Stay: Payer: Medicare Other | Attending: Oncology

## 2018-03-09 ENCOUNTER — Other Ambulatory Visit: Payer: Medicare Other

## 2018-03-09 VITALS — BP 109/76 | HR 72 | Temp 96.5°F | Resp 17

## 2018-03-09 DIAGNOSIS — G35 Multiple sclerosis: Secondary | ICD-10-CM

## 2018-03-09 DIAGNOSIS — Z5112 Encounter for antineoplastic immunotherapy: Secondary | ICD-10-CM | POA: Diagnosis not present

## 2018-03-09 MED ORDER — ACETAMINOPHEN 500 MG PO TABS
1000.0000 mg | ORAL_TABLET | Freq: Once | ORAL | Status: AC
  Administered 2018-03-09: 1000 mg via ORAL

## 2018-03-09 MED ORDER — SODIUM CHLORIDE 0.9 % IV SOLN
300.0000 mg | Freq: Once | INTRAVENOUS | Status: AC
  Administered 2018-03-09: 300 mg via INTRAVENOUS
  Filled 2018-03-09: qty 15

## 2018-03-09 MED ORDER — SODIUM CHLORIDE 0.9 % IV SOLN
Freq: Once | INTRAVENOUS | Status: AC
  Administered 2018-03-09: 10:00:00 via INTRAVENOUS
  Filled 2018-03-09: qty 1000

## 2018-04-06 ENCOUNTER — Inpatient Hospital Stay: Payer: Medicare Other | Attending: Oncology

## 2018-04-06 VITALS — BP 108/72 | HR 79 | Resp 18

## 2018-04-06 DIAGNOSIS — G35 Multiple sclerosis: Secondary | ICD-10-CM | POA: Insufficient documentation

## 2018-04-06 DIAGNOSIS — Z5112 Encounter for antineoplastic immunotherapy: Secondary | ICD-10-CM | POA: Diagnosis present

## 2018-04-06 MED ORDER — SODIUM CHLORIDE 0.9 % IV SOLN
300.0000 mg | Freq: Once | INTRAVENOUS | Status: AC
  Administered 2018-04-06: 300 mg via INTRAVENOUS
  Filled 2018-04-06: qty 15

## 2018-04-06 MED ORDER — ACETAMINOPHEN 500 MG PO TABS
1000.0000 mg | ORAL_TABLET | Freq: Once | ORAL | Status: AC
  Administered 2018-04-06: 1000 mg via ORAL
  Filled 2018-04-06: qty 2

## 2018-04-06 MED ORDER — SODIUM CHLORIDE 0.9 % IV SOLN
Freq: Once | INTRAVENOUS | Status: AC
  Administered 2018-04-06: 09:00:00 via INTRAVENOUS
  Filled 2018-04-06: qty 250

## 2018-04-06 NOTE — Patient Instructions (Signed)

## 2018-04-30 NOTE — Progress Notes (Signed)
[facility]  Telephone:(336) [phone] [phone]) [phone]  ID: Janalee Dane OB: 1975/10/15  MR#: [id number]  AOZ#:[id number]  [facility], [place], [place]  CHIEF COMPLAINT: Tysabri infusion for multiple sclerosis.   INTERVAL HISTORY: Patient returns to clinic today for routine six-month evaluation and continuation of Tysabri infusions.  He currently feels well and is asymptomatic.  He does notice increased weakness and fatigue in the days leading up to his infusion.  He denies any recent fevers or illnesses. He has no new neurologic complaints.  He denies any chest pain or shortness of breath.  He has a good appetite and denies weight loss.  He has no nausea, vomiting, constipation, or diarrhea.  He has no urinary complaints.  Patient offers no specific complaints today.    REVIEW OF SYSTEMS:   Review of Systems  Constitutional: Negative.  Negative for fever, malaise/fatigue and weight loss.  Respiratory: Negative.  Negative for cough and shortness of breath.   Cardiovascular: Negative.  Negative for chest pain and leg swelling.  Gastrointestinal: Negative.  Negative for abdominal pain.  Genitourinary: Negative.  Negative for dysuria.  Musculoskeletal: Negative.  Negative for back pain.  Skin: Negative.  Negative for rash.  Neurological: Negative.  Negative for sensory change, focal weakness, weakness and headaches.  Psychiatric/Behavioral: Negative.  The patient is not nervous/anxious.    As per HPI. Otherwise, a complete review of systems is negative.   PAST MEDICAL HISTORY: Multiple sclerosis, hypertension.  PAST SURGICAL HISTORY: Reviewed and unchanged.  FAMILY HISTORY: Reviewed and unchanged. No report of chronic disease.      ADVANCED DIRECTIVES:    HEALTH MAINTENANCE: Social History   Tobacco Use  . Smoking status: Not on file  Substance Use Topics  . Alcohol use: Not on file  . Drug use: Not on file     Colonoscopy:  PAP:  Bone  density:  Lipid panel:  No Known Allergies  Current Outpatient Medications  Medication Sig Dispense Refill  . dalfampridine (AMPYRA) 10 MG TB12 Take 10 mg by mouth 2 (two) times daily.    . ferrous fumarate (HEMOCYTE - 106 MG FE) 325 (106 FE) MG TABS tablet Take 1 tablet by mouth.    Marland Kitchen lisinopril-hydrochlorothiazide (PRINZIDE,ZESTORETIC) 20-25 MG per tablet Take 1 tablet by mouth daily.    . Multiple Vitamin (MULTIVITAMIN) tablet Take 1 tablet by mouth daily.    . Omega-3 Fatty Acids (FISH OIL) 1000 MG CAPS Take 1 capsule by mouth daily.     No current facility-administered medications for this visit.     OBJECTIVE:  There were no vitals filed for this visit. ECOG FS:0 - Asymptomatic  General: Well-developed, well-nourished, no acute distress. Eyes: Pink conjunctiva, anicteric sclera. HEENT: Normocephalic, moist mucous membranes. Lungs: Clear to auscultation bilaterally. Heart: Regular rate and rhythm. No rubs, murmurs, or gallops. Abdomen: Soft, nontender, nondistended. No organomegaly noted, normoactive bowel sounds. Musculoskeletal: No edema, cyanosis, or clubbing. Neuro: Alert, answering all questions appropriately. Cranial nerves grossly intact. Skin: No rashes or petechiae noted. Psych: Normal affect.  LAB RESULTS:  Lab Results  Component Value Date   NA 132 (L) 08/25/2017   K 3.7 08/25/2017   CL 100 (L) 08/25/2017   CO2 24 08/25/2017   GLUCOSE 145 (H) 08/25/2017   BUN 19 08/25/2017   CREATININE 0.83 08/25/2017   CALCIUM 8.7 (L) 08/25/2017   PROT 7.5 08/25/2017   ALBUMIN 4.3 08/25/2017   AST 23 08/25/2017   ALT 24 08/25/2017   ALKPHOS 90 08/25/2017  BILITOT 0.9 08/25/2017   GFRNONAA >60 08/25/2017   GFRAA >60 08/25/2017    Lab Results  Component Value Date   WBC 8.0 08/25/2017   NEUTROABS 3.5 08/25/2017   HGB 13.8 08/25/2017   HCT 38.9 (L) 08/25/2017   MCV 94.3 08/25/2017   PLT 227 08/25/2017     STUDIES: No results found.  ASSESSMENT:   Tysabri infusion for multiple sclerosis.   PLAN:    1. Multiple sclerosis: Continue 300 mg IV Tysabri every 4 weeks.  Patient reports Dr. Meredeth Ide has retired and he has a new neurologist, but cannot recall her name.  Patient expressed understanding that all laboratory work including his JC virus titers, will be checked by his Neurologist. He also understands that any questions or concerns regarding Tysabri or his multiple sclerosis should be directed towards his Neurologist.  Return to clinic every 4 weeks for treatment and then in 6 months for routine evaluation.  I spent a total of 30 minutes face-to-face with the patient of which greater than 50% of the visit was spent in counseling and coordination of care as detailed above.  Patient expressed understanding and was in agreement with this plan. He also understands that He can call clinic at any time with any questions, concerns, or complaints.   Neurologist at [facility]:   Jeralyn Ruths, MD   05/06/2018 9:53 AM

## 2018-05-04 ENCOUNTER — Other Ambulatory Visit: Payer: Medicare Other

## 2018-05-04 ENCOUNTER — Inpatient Hospital Stay: Payer: Medicare Other

## 2018-05-04 ENCOUNTER — Inpatient Hospital Stay: Payer: Medicare Other | Attending: Oncology | Admitting: Oncology

## 2018-05-04 VITALS — BP 109/73 | HR 64 | Temp 96.1°F | Resp 18

## 2018-05-04 DIAGNOSIS — G35 Multiple sclerosis: Secondary | ICD-10-CM | POA: Diagnosis present

## 2018-05-04 MED ORDER — SODIUM CHLORIDE 0.9 % IV SOLN
300.0000 mg | Freq: Once | INTRAVENOUS | Status: AC
  Administered 2018-05-04: 300 mg via INTRAVENOUS
  Filled 2018-05-04: qty 15

## 2018-05-04 MED ORDER — ACETAMINOPHEN 500 MG PO TABS
1000.0000 mg | ORAL_TABLET | Freq: Once | ORAL | Status: AC
  Administered 2018-05-04: 1000 mg via ORAL
  Filled 2018-05-04: qty 2

## 2018-05-04 MED ORDER — SODIUM CHLORIDE 0.9 % IV SOLN
Freq: Once | INTRAVENOUS | Status: AC
  Administered 2018-05-04: 10:00:00 via INTRAVENOUS
  Filled 2018-05-04: qty 250

## 2018-06-01 ENCOUNTER — Inpatient Hospital Stay: Payer: Medicare Other | Attending: Oncology

## 2018-06-01 VITALS — BP 109/70 | HR 75 | Temp 96.7°F | Resp 18

## 2018-06-01 DIAGNOSIS — G35 Multiple sclerosis: Secondary | ICD-10-CM

## 2018-06-01 DIAGNOSIS — Z5112 Encounter for antineoplastic immunotherapy: Secondary | ICD-10-CM | POA: Diagnosis present

## 2018-06-01 MED ORDER — SODIUM CHLORIDE 0.9 % IV SOLN
300.0000 mg | Freq: Once | INTRAVENOUS | Status: AC
  Administered 2018-06-01: 300 mg via INTRAVENOUS
  Filled 2018-06-01: qty 15

## 2018-06-01 MED ORDER — ACETAMINOPHEN 500 MG PO TABS
1000.0000 mg | ORAL_TABLET | Freq: Once | ORAL | Status: AC
  Administered 2018-06-01: 1000 mg via ORAL
  Filled 2018-06-01: qty 2

## 2018-06-01 MED ORDER — SODIUM CHLORIDE 0.9 % IV SOLN
Freq: Once | INTRAVENOUS | Status: AC
  Administered 2018-06-01: 10:00:00 via INTRAVENOUS
  Filled 2018-06-01: qty 250

## 2018-07-02 ENCOUNTER — Inpatient Hospital Stay: Payer: Medicare Other | Attending: Oncology

## 2018-07-02 VITALS — BP 108/78 | HR 70 | Temp 95.9°F | Resp 18

## 2018-07-02 DIAGNOSIS — Z5112 Encounter for antineoplastic immunotherapy: Secondary | ICD-10-CM | POA: Insufficient documentation

## 2018-07-02 DIAGNOSIS — G35 Multiple sclerosis: Secondary | ICD-10-CM | POA: Diagnosis present

## 2018-07-02 MED ORDER — ACETAMINOPHEN 500 MG PO TABS
1000.0000 mg | ORAL_TABLET | Freq: Once | ORAL | Status: AC
  Administered 2018-07-02: 1000 mg via ORAL
  Filled 2018-07-02: qty 2

## 2018-07-02 MED ORDER — SODIUM CHLORIDE 0.9 % IV SOLN
Freq: Once | INTRAVENOUS | Status: AC
  Administered 2018-07-02: 10:00:00 via INTRAVENOUS
  Filled 2018-07-02: qty 250

## 2018-07-02 MED ORDER — SODIUM CHLORIDE 0.9 % IV SOLN
300.0000 mg | Freq: Once | INTRAVENOUS | Status: AC
  Administered 2018-07-02: 300 mg via INTRAVENOUS
  Filled 2018-07-02: qty 15

## 2018-07-02 NOTE — Patient Instructions (Signed)

## 2018-07-27 ENCOUNTER — Ambulatory Visit: Payer: Medicare Other

## 2018-08-03 ENCOUNTER — Inpatient Hospital Stay: Payer: Medicare Other | Attending: Oncology

## 2018-08-03 ENCOUNTER — Inpatient Hospital Stay: Payer: Medicare Other

## 2018-08-03 VITALS — BP 113/73 | HR 97 | Temp 97.6°F | Resp 17

## 2018-08-03 DIAGNOSIS — G35 Multiple sclerosis: Secondary | ICD-10-CM | POA: Insufficient documentation

## 2018-08-03 MED ORDER — SODIUM CHLORIDE 0.9 % IV SOLN
Freq: Once | INTRAVENOUS | Status: AC
  Administered 2018-08-03: 14:00:00 via INTRAVENOUS
  Filled 2018-08-03: qty 250

## 2018-08-03 MED ORDER — SODIUM CHLORIDE 0.9 % IV SOLN
300.0000 mg | Freq: Once | INTRAVENOUS | Status: AC
  Administered 2018-08-03: 300 mg via INTRAVENOUS
  Filled 2018-08-03: qty 15

## 2018-08-03 MED ORDER — ACETAMINOPHEN 500 MG PO TABS
1000.0000 mg | ORAL_TABLET | Freq: Once | ORAL | Status: AC
  Administered 2018-08-03: 1000 mg via ORAL
  Filled 2018-08-03: qty 2

## 2018-08-14 ENCOUNTER — Encounter: Payer: Self-pay | Admitting: Pharmacy Technician

## 2018-08-14 NOTE — Progress Notes (Signed)
Patient no longer getting Tysabri from [employer]. based on [employer] grant for co pay assistance. Last DOS covered is 07/05/18.

## 2018-08-31 ENCOUNTER — Inpatient Hospital Stay: Payer: Medicare Other

## 2018-08-31 VITALS — BP 109/70 | HR 72 | Temp 97.7°F | Resp 16

## 2018-08-31 DIAGNOSIS — G35 Multiple sclerosis: Secondary | ICD-10-CM | POA: Diagnosis not present

## 2018-08-31 MED ORDER — SODIUM CHLORIDE 0.9 % IV SOLN
Freq: Once | INTRAVENOUS | Status: AC
  Administered 2018-08-31: 09:00:00 via INTRAVENOUS
  Filled 2018-08-31: qty 250

## 2018-08-31 MED ORDER — SODIUM CHLORIDE 0.9 % IV SOLN
300.0000 mg | Freq: Once | INTRAVENOUS | Status: AC
  Administered 2018-08-31: 300 mg via INTRAVENOUS
  Filled 2018-08-31: qty 15

## 2018-08-31 MED ORDER — ACETAMINOPHEN 500 MG PO TABS
1000.0000 mg | ORAL_TABLET | Freq: Once | ORAL | Status: AC
  Administered 2018-08-31: 1000 mg via ORAL
  Filled 2018-08-31: qty 2

## 2018-08-31 NOTE — Patient Instructions (Signed)
Natalizumab injection What is this medicine? NATALIZUMAB (na ta LIZ you mab) is used to treat relapsing multiple sclerosis. This drug is not a cure. It is also used to treat Crohn's disease. This medicine may be used for other purposes; ask your health care provider or pharmacist if you have questions. COMMON BRAND NAME(S): Tysabri What should I tell my health care provider before I take this medicine? They need to know if you have any of these conditions: -immune system problems -progressive multifocal leukoencephalopathy (PML) -an unusual or allergic reaction to natalizumab, other medicines, foods, dyes, or preservatives -pregnant or trying to get pregnant -breast-feeding How should I use this medicine? This medicine is for infusion into a vein. It is given by a health care professional in a hospital or clinic setting. A special MedGuide will be given to you by the pharmacist with each prescription and refill. Be sure to read this information carefully each time. Talk to your pediatrician regarding the use of this medicine in children. This medicine is not approved for use in children. Overdosage: If you think you have taken too much of this medicine contact a poison control center or emergency room at once. NOTE: This medicine is only for you. Do not share this medicine with others. What if I miss a dose? It is important not to miss your dose. Call your doctor or health care professional if you are unable to keep an appointment. What may interact with this medicine? -azathioprine -cyclosporine -interferon -6-mercaptopurine -methotrexate -steroid medicines like prednisone or cortisone -TNF-alpha inhibitors like adalimumab, etanercept, and infliximab -vaccines This list may not describe all possible interactions. Give your health care provider a list of all the medicines, herbs, non-prescription drugs, or dietary supplements you use. Also tell them if you smoke, drink alcohol, or use  illegal drugs. Some items may interact with your medicine. What should I watch for while using this medicine? Your condition will be monitored carefully while you are receiving this medicine. Visit your doctor for regular check ups. Tell your doctor or healthcare professional if your symptoms do not start to get better or if they get worse. Stay away from people who are sick. Call your doctor or health care professional for advice if you get a fever, chills or sore throat, or other symptoms of a cold or flu. Do not treat yourself. In some patients, this medicine may cause a serious brain infection that may cause death. If you have any problems seeing, thinking, speaking, walking, or standing, tell your doctor right away. If you cannot reach your doctor, get urgent medical care. What side effects may I notice from receiving this medicine? Side effects that you should report to your doctor or health care professional as soon as possible: -allergic reactions like skin rash, itching or hives, swelling of the face, lips, or tongue -breathing problems -changes in vision -chest pain -dark urine -depression, feelings of sadness -dizziness -general ill feeling or flu-like symptoms -irregular, missed, or painful menstrual periods -light-colored stools -loss of appetite, nausea -muscle weakness -problems with balance, talking, or walking -right upper belly pain -unusually weak or tired -yellowing of the eyes or skin Side effects that usually do not require medical attention (report to your doctor or health care professional if they continue or are bothersome): -aches, pains -headache -stomach upset -tiredness This list may not describe all possible side effects. Call your doctor for medical advice about side effects. You may report side effects to FDA at 1-800-FDA-1088. Where should I keep   my medicine? This drug is given in a hospital or clinic and will not be stored at home. NOTE: This sheet is  a summary. It may not cover all possible information. If you have questions about this medicine, talk to your doctor, pharmacist, or health care provider.  2019 Elsevier/Gold Standard (2008-09-06 13:33:21)  

## 2018-09-26 ENCOUNTER — Encounter: Payer: Self-pay | Admitting: Pharmacy Technician

## 2018-09-26 NOTE — Progress Notes (Signed)
Patient has [employer] co pay assistance effective dates 02/06/18-05/07/19 ID #[id number] for $5800.

## 2018-09-28 ENCOUNTER — Inpatient Hospital Stay: Payer: Medicare Other | Attending: Hematology and Oncology

## 2018-09-28 VITALS — BP 107/74 | HR 80 | Temp 96.2°F | Resp 16

## 2018-09-28 DIAGNOSIS — G35 Multiple sclerosis: Secondary | ICD-10-CM | POA: Diagnosis not present

## 2018-09-28 MED ORDER — SODIUM CHLORIDE 0.9 % IV SOLN
Freq: Once | INTRAVENOUS | Status: AC
  Administered 2018-09-28: 09:00:00 via INTRAVENOUS
  Filled 2018-09-28: qty 250

## 2018-09-28 MED ORDER — ACETAMINOPHEN 500 MG PO TABS
1000.0000 mg | ORAL_TABLET | Freq: Once | ORAL | Status: AC
  Administered 2018-09-28: 1000 mg via ORAL

## 2018-09-28 MED ORDER — ACETAMINOPHEN 500 MG PO TABS
ORAL_TABLET | ORAL | Status: AC
  Filled 2018-09-28: qty 2

## 2018-09-28 MED ORDER — SODIUM CHLORIDE 0.9 % IV SOLN
300.0000 mg | Freq: Once | INTRAVENOUS | Status: AC
  Administered 2018-09-28: 300 mg via INTRAVENOUS
  Filled 2018-09-28: qty 15

## 2018-09-28 NOTE — Patient Instructions (Signed)
Natalizumab injection What is this medicine? NATALIZUMAB (na ta LIZ you mab) is used to treat relapsing multiple sclerosis. This drug is not a cure. It is also used to treat Crohn's disease. This medicine may be used for other purposes; ask your health care provider or pharmacist if you have questions. COMMON BRAND NAME(S): Tysabri What should I tell my health care provider before I take this medicine? They need to know if you have any of these conditions: -immune system problems -progressive multifocal leukoencephalopathy (PML) -an unusual or allergic reaction to natalizumab, other medicines, foods, dyes, or preservatives -pregnant or trying to get pregnant -breast-feeding How should I use this medicine? This medicine is for infusion into a vein. It is given by a health care professional in a hospital or clinic setting. A special MedGuide will be given to you by the pharmacist with each prescription and refill. Be sure to read this information carefully each time. Talk to your pediatrician regarding the use of this medicine in children. This medicine is not approved for use in children. Overdosage: If you think you have taken too much of this medicine contact a poison control center or emergency room at once. NOTE: This medicine is only for you. Do not share this medicine with others. What if I miss a dose? It is important not to miss your dose. Call your doctor or health care professional if you are unable to keep an appointment. What may interact with this medicine? -azathioprine -cyclosporine -interferon -6-mercaptopurine -methotrexate -steroid medicines like prednisone or cortisone -TNF-alpha inhibitors like adalimumab, etanercept, and infliximab -vaccines This list may not describe all possible interactions. Give your health care provider a list of all the medicines, herbs, non-prescription drugs, or dietary supplements you use. Also tell them if you smoke, drink alcohol, or use  illegal drugs. Some items may interact with your medicine. What should I watch for while using this medicine? Your condition will be monitored carefully while you are receiving this medicine. Visit your doctor for regular check ups. Tell your doctor or healthcare professional if your symptoms do not start to get better or if they get worse. Stay away from people who are sick. Call your doctor or health care professional for advice if you get a fever, chills or sore throat, or other symptoms of a cold or flu. Do not treat yourself. In some patients, this medicine may cause a serious brain infection that may cause death. If you have any problems seeing, thinking, speaking, walking, or standing, tell your doctor right away. If you cannot reach your doctor, get urgent medical care. What side effects may I notice from receiving this medicine? Side effects that you should report to your doctor or health care professional as soon as possible: -allergic reactions like skin rash, itching or hives, swelling of the face, lips, or tongue -breathing problems -changes in vision -chest pain -dark urine -depression, feelings of sadness -dizziness -general ill feeling or flu-like symptoms -irregular, missed, or painful menstrual periods -light-colored stools -loss of appetite, nausea -muscle weakness -problems with balance, talking, or walking -right upper belly pain -unusually weak or tired -yellowing of the eyes or skin Side effects that usually do not require medical attention (report to your doctor or health care professional if they continue or are bothersome): -aches, pains -headache -stomach upset -tiredness This list may not describe all possible side effects. Call your doctor for medical advice about side effects. You may report side effects to FDA at 1-800-FDA-1088. Where should I keep   my medicine? This drug is given in a hospital or clinic and will not be stored at home. NOTE: This sheet is  a summary. It may not cover all possible information. If you have questions about this medicine, talk to your doctor, pharmacist, or health care provider.  2019 Elsevier/Gold Standard (2008-09-06 13:33:21)  

## 2018-10-25 ENCOUNTER — Other Ambulatory Visit: Payer: Self-pay

## 2018-10-26 ENCOUNTER — Ambulatory Visit: Payer: Medicare Other | Admitting: Oncology

## 2018-10-26 ENCOUNTER — Encounter: Payer: Self-pay | Admitting: Hematology and Oncology

## 2018-10-26 ENCOUNTER — Inpatient Hospital Stay: Payer: Medicare Other

## 2018-10-26 ENCOUNTER — Inpatient Hospital Stay: Payer: Medicare Other | Attending: Oncology

## 2018-10-26 ENCOUNTER — Inpatient Hospital Stay (HOSPITAL_BASED_OUTPATIENT_CLINIC_OR_DEPARTMENT_OTHER): Payer: Medicare Other | Admitting: Hematology and Oncology

## 2018-10-26 ENCOUNTER — Other Ambulatory Visit: Payer: Self-pay

## 2018-10-26 VITALS — BP 111/70 | HR 104 | Temp 98.2°F | Resp 18 | Wt 192.9 lb

## 2018-10-26 VITALS — BP 119/78 | HR 69 | Resp 18

## 2018-10-26 DIAGNOSIS — G35 Multiple sclerosis: Secondary | ICD-10-CM

## 2018-10-26 LAB — CBC WITH DIFFERENTIAL/PLATELET
Abs Immature Granulocytes: 0.02 10*3/uL (ref 0.00–0.07)
Basophils Absolute: 0.1 10*3/uL (ref 0.0–0.1)
Basophils Relative: 2 %
Eosinophils Absolute: 0.1 10*3/uL (ref 0.0–0.5)
Eosinophils Relative: 2 %
HCT: 41.3 % (ref 39.0–52.0)
Hemoglobin: 14.2 g/dL (ref 13.0–17.0)
Immature Granulocytes: 0 %
Lymphocytes Relative: 42 %
Lymphs Abs: 2.8 10*3/uL (ref 0.7–4.0)
MCH: 33.3 pg (ref 26.0–34.0)
MCHC: 34.4 g/dL (ref 30.0–36.0)
MCV: 96.9 fL (ref 80.0–100.0)
Monocytes Absolute: 0.8 10*3/uL (ref 0.1–1.0)
Monocytes Relative: 11 %
Neutro Abs: 2.9 10*3/uL (ref 1.7–7.7)
Neutrophils Relative %: 43 %
Platelets: 245 10*3/uL (ref 150–400)
RBC: 4.26 MIL/uL (ref 4.22–5.81)
RDW: 12.8 % (ref 11.5–15.5)
WBC: 6.6 10*3/uL (ref 4.0–10.5)
nRBC: 1.4 % — ABNORMAL HIGH (ref 0.0–0.2)

## 2018-10-26 LAB — COMPREHENSIVE METABOLIC PANEL
ALT: 25 U/L (ref 0–44)
AST: 21 U/L (ref 15–41)
Albumin: 4.2 g/dL (ref 3.5–5.0)
Alkaline Phosphatase: 91 U/L (ref 38–126)
Anion gap: 7 (ref 5–15)
BUN: 16 mg/dL (ref 6–20)
CO2: 27 mmol/L (ref 22–32)
Calcium: 9.1 mg/dL (ref 8.9–10.3)
Chloride: 101 mmol/L (ref 98–111)
Creatinine, Ser: 0.82 mg/dL (ref 0.61–1.24)
GFR calc Af Amer: >60 mL/min (ref >60)
GFR calc non Af Amer: >60 mL/min (ref >60)
Glucose, Bld: 84 mg/dL (ref 70–99)
Potassium: 4.3 mmol/L (ref 3.5–5.1)
Sodium: 135 mmol/L (ref 135–145)
Total Bilirubin: 0.4 mg/dL (ref 0.3–1.2)
Total Protein: 7.8 g/dL (ref 6.5–8.1)

## 2018-10-26 MED ORDER — SODIUM CHLORIDE 0.9 % IV SOLN
300.0000 mg | Freq: Once | INTRAVENOUS | Status: AC
  Administered 2018-10-26: 300 mg via INTRAVENOUS
  Filled 2018-10-26: qty 15

## 2018-10-26 MED ORDER — SODIUM CHLORIDE 0.9 % IV SOLN
Freq: Once | INTRAVENOUS | Status: AC
  Administered 2018-10-26: 10:00:00 via INTRAVENOUS
  Filled 2018-10-26: qty 250

## 2018-10-26 MED ORDER — ACETAMINOPHEN 500 MG PO TABS
1000.0000 mg | ORAL_TABLET | Freq: Once | ORAL | Status: AC
  Administered 2018-10-26: 1000 mg via ORAL
  Filled 2018-10-26: qty 2

## 2018-10-26 NOTE — Patient Instructions (Signed)
Natalizumab injection What is this medicine? NATALIZUMAB (na ta LIZ you mab) is used to treat relapsing multiple sclerosis. This drug is not a cure. It is also used to treat Crohn's disease. This medicine may be used for other purposes; ask your health care provider or pharmacist if you have questions. COMMON BRAND NAME(S): Tysabri What should I tell my health care provider before I take this medicine? They need to know if you have any of these conditions: -immune system problems -progressive multifocal leukoencephalopathy (PML) -an unusual or allergic reaction to natalizumab, other medicines, foods, dyes, or preservatives -pregnant or trying to get pregnant -breast-feeding How should I use this medicine? This medicine is for infusion into a vein. It is given by a health care professional in a hospital or clinic setting. A special MedGuide will be given to you by the pharmacist with each prescription and refill. Be sure to read this information carefully each time. Talk to your pediatrician regarding the use of this medicine in children. This medicine is not approved for use in children. Overdosage: If you think you have taken too much of this medicine contact a poison control center or emergency room at once. NOTE: This medicine is only for you. Do not share this medicine with others. What if I miss a dose? It is important not to miss your dose. Call your doctor or health care professional if you are unable to keep an appointment. What may interact with this medicine? -azathioprine -cyclosporine -interferon -6-mercaptopurine -methotrexate -steroid medicines like prednisone or cortisone -TNF-alpha inhibitors like adalimumab, etanercept, and infliximab -vaccines This list may not describe all possible interactions. Give your health care provider a list of all the medicines, herbs, non-prescription drugs, or dietary supplements you use. Also tell them if you smoke, drink alcohol, or use  illegal drugs. Some items may interact with your medicine. What should I watch for while using this medicine? Your condition will be monitored carefully while you are receiving this medicine. Visit your doctor for regular check ups. Tell your doctor or healthcare professional if your symptoms do not start to get better or if they get worse. Stay away from people who are sick. Call your doctor or health care professional for advice if you get a fever, chills or sore throat, or other symptoms of a cold or flu. Do not treat yourself. In some patients, this medicine may cause a serious brain infection that may cause death. If you have any problems seeing, thinking, speaking, walking, or standing, tell your doctor right away. If you cannot reach your doctor, get urgent medical care. What side effects may I notice from receiving this medicine? Side effects that you should report to your doctor or health care professional as soon as possible: -allergic reactions like skin rash, itching or hives, swelling of the face, lips, or tongue -breathing problems -changes in vision -chest pain -dark urine -depression, feelings of sadness -dizziness -general ill feeling or flu-like symptoms -irregular, missed, or painful menstrual periods -light-colored stools -loss of appetite, nausea -muscle weakness -problems with balance, talking, or walking -right upper belly pain -unusually weak or tired -yellowing of the eyes or skin Side effects that usually do not require medical attention (report to your doctor or health care professional if they continue or are bothersome): -aches, pains -headache -stomach upset -tiredness This list may not describe all possible side effects. Call your doctor for medical advice about side effects. You may report side effects to FDA at 1-800-FDA-1088. Where should I keep   my medicine? This drug is given in a hospital or clinic and will not be stored at home. NOTE: This sheet is  a summary. It may not cover all possible information. If you have questions about this medicine, talk to your doctor, pharmacist, or health care provider.  2019 Elsevier/Gold Standard (2008-09-06 13:33:21)  

## 2018-10-26 NOTE — Progress Notes (Signed)
[facility]  [address] [place], [place] [address] Phone: [phone]  Fax: [phone]   Clinic day:  10/26/2018  Chief Complaint: Jerry Bautista is a 43 y.o. male  with multiple sclerosis who is seen for continuation of Tysabri.  HPI:  The patient was diagnosed with multiple sclerosis in 2000.  He notes that his "eyes were paralyzed".  He had double vision.  He denied any numbness, tingling, balance or coordination problems.    He was first seen by Dr. Orlene Erm in [place].  He was treated with steroids then Rebif/Avonex.  He was then followed by Dr. Meredeth Ide.  He was last seen by Dr.Rukmini Gwendalyn Ege on 11/23/2017.  At that time, he was doing well on Tysabri.  At his last visit, he had bilateral leg weakness as a result of his MS. His right leg was weaker than his left. He denied any recent flares.  He commented that with stress or fatigue, his legs drag more. He denied any problems with his vision. He denied any numbness or tingling in his arms and legs. He was on Ampyra and Baclofen to help with spasticity in his legs.   Head MRI on 12/26/2017 revealed  no change moderate cerebral and cerebellar signal abnormalities consistent with the patient's history of multiple sclerosis. Many lesions demonstrate decreased T1 signal.  There was no change moderate atrophy.  There was no abnormal enhancement to confirm active demyelination. There was no MR evidence of PML.  Cervical and thoracic spine MRI on 12/26/2017 revealed mild/moderate patchy T2 signal abnormalities in the cervical and thoracic spinal cord, c/w the patient's history of multiple sclerosis. There was no abnormal enhancement to confirm active demyelination.  JC antibody was negative (index 0.10) on 11/23/2017.  The patient was last seen by Dr. Orlie Dakin on 05/04/2018.  At that time, felt well and was asymptomatic.  He noticed increased weakness and fatigue in the days leading up to his infusion.   He states that he missed his last appointment with Dr. Gwendalyn Ege, but has an appointment in 04/2019.  He has continued to received Tysabri every 4 weeks (10/04, 11/01, 12/02, 01/03, 01/31, and 09/28/2018).  Symptomatically, he denies any new complaints.  He denies any issues with infections.   Past Medical History:  Diagnosis Date  . Hypertension   . MS (multiple sclerosis) (HCC)     History reviewed. No pertinent surgical history.  History reviewed. No pertinent family history.  Social History:  reports that he has never smoked. He has never used smokeless tobacco. No history on file for alcohol and drug. He does not work.  He is on disability/retired in 1998.  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  He has 2 daughters.  He lives in [place].  The patient is alone today.  Allergies: No Known Allergies  Current Medications: Current Outpatient Medications  Medication Sig Dispense Refill  . dalfampridine (AMPYRA) 10 MG TB12 Take 10 mg by mouth 2 (two) times daily.    . ferrous fumarate (HEMOCYTE - 106 MG FE) 325 (106 FE) MG TABS tablet Take 1 tablet by mouth.    Marland Kitchen lisinopril-hydrochlorothiazide (PRINZIDE,ZESTORETIC) 20-25 MG per tablet Take 1 tablet by mouth daily.    . Multiple Vitamin (MULTIVITAMIN) tablet Take 1 tablet by mouth daily.    . Omega-3 Fatty Acids (FISH OIL) 1000 MG CAPS Take 1 capsule by mouth daily.     No current facility-administered medications for this visit.  Review of Systems:  GENERAL:  Feels good.  No fevers, sweats or weight loss. PERFORMANCE STATUS (ECOG):  1 HEENT:  No visual changes, runny nose, sore throat, mouth sores or tenderness. Lungs: No shortness of breath or cough.  No hemoptysis. Cardiac:  Hypertension.  No chest pain, palpitations, orthopnea, or PND. GI:  No nausea, vomiting, diarrhea, constipation, melena or hematochezia. GU:  No urgency, frequency, dysuria, or hematuria. Musculoskeletal:  No back pain.  No  joint pain.  No muscle tenderness. Extremities:  No pain or swelling. Skin:  No rashes or skin changes. Neuro:  Multiple sclerosis.  No headache, focal numbness or weakness, balance or coordination issues. Endocrine:  No diabetes, thyroid issues, hot flashes or night sweats. Psych:  No mood changes, depression or anxiety. Pain:  No focal pain. Review of systems:  All other systems reviewed and found to be negative.  Physical Exam: Blood pressure 111/70, pulse (!) 104, temperature 98.2 F (36.8 C), temperature source Tympanic, resp. rate 18, weight 192 lb 14.4 oz (87.5 kg), SpO2 100 %. GENERAL:  Well developed, well nourished,gentleman sitting comfortably in the exam room in no acute distress. MENTAL STATUS:  Alert and oriented to person, place and time. HEAD:  Dortch black hair.  Slight mustache.  Normocephalic, atraumatic, face symmetric, no Cushingoid features. EYES:  Brown eyes.  Pupils equal round and reactive to light and accomodation.  No conjunctivitis or scleral icterus. ENT:  Oropharynx clear without lesion.  Tongue normal. Mucous membranes moist.  RESPIRATORY:  Clear to auscultation without rales, wheezes or rhonchi. CARDIOVASCULAR:  Regular rate and rhythm without murmur, rub or gallop. ABDOMEN:  Soft, non-tender, with active bowel sounds, and no hepatosplenomegaly.  No masses. SKIN:  Tattoo.  No rashes, ulcers or lesions. EXTREMITIES: No edema, no skin discoloration or tenderness.  No palpable cords. LYMPH NODES: No palpable cervical, supraclavicular, axillary or inguinal adenopathy  NEUROLOGICAL:  Alert & oriented, cranial nerves II-XII intact; motor strength 5/5 throughout except slight decreased strength left interosseous muscles; sensation intact; finger to nose and RAM normal; 2++ left patellar reflex; difficulty walking heel to toe; negative Rhomberg; no clonus or Babinski. Marland Kitchen PSYCH:  Appropriate.   Appointment on 10/26/2018  Component Date Value Ref Range Status  .  Sodium 10/26/2018 135  135 - 145 mmol/L Final  . Potassium 10/26/2018 4.3  3.5 - 5.1 mmol/L Final  . Chloride 10/26/2018 101  98 - 111 mmol/L Final  . CO2 10/26/2018 27  22 - 32 mmol/L Final  . Glucose, Bld 10/26/2018 84  70 - 99 mg/dL Final  . BUN 24/58/0998 16  6 - 20 mg/dL Final  . Creatinine, Ser 10/26/2018 0.82  0.61 - 1.24 mg/dL Final  . Calcium 33/82/5053 9.1  8.9 - 10.3 mg/dL Final  . Total Protein 10/26/2018 7.8  6.5 - 8.1 g/dL Final  . Albumin 97/67/3419 4.2  3.5 - 5.0 g/dL Final  . AST 37/90/2409 21  15 - 41 U/L Final  . ALT 10/26/2018 25  0 - 44 U/L Final  . Alkaline Phosphatase 10/26/2018 91  38 - 126 U/L Final  . Total Bilirubin 10/26/2018 0.4  0.3 - 1.2 mg/dL Final  . GFR calc non Af Amer 10/26/2018 >60  >60 mL/min Final  . GFR calc Af Amer 10/26/2018 >60  >60 mL/min Final  . Anion gap 10/26/2018 7  5 - 15 Final   Performed at [facility], [address] [place], [place] [address]  . WBC 10/26/2018 6.6  4.0 - 10.5 K/uL Final  . RBC 10/26/2018 4.26  4.22 - 5.81 MIL/uL Final  . Hemoglobin 10/26/2018 14.2  13.0 - 17.0 g/dL Final  . HCT 37/05/6268 41.3  39.0 - 52.0 % Final  . MCV 10/26/2018 96.9  80.0 - 100.0 fL Final  . MCH 10/26/2018 33.3  26.0 - 34.0 pg Final  . MCHC 10/26/2018 34.4  30.0 - 36.0 g/dL Final  . RDW 48/54/6270 12.8  11.5 - 15.5 % Final  . Platelets 10/26/2018 245  150 - 400 K/uL Final  . nRBC 10/26/2018 1.4* 0.0 - 0.2 % Final  . Neutrophils Relative % 10/26/2018 43  % Final  . Neutro Abs 10/26/2018 2.9  1.7 - 7.7 K/uL Final  . Lymphocytes Relative 10/26/2018 42  % Final  . Lymphs Abs 10/26/2018 2.8  0.7 - 4.0 K/uL Final  . Monocytes Relative 10/26/2018 11  % Final  . Monocytes Absolute 10/26/2018 0.8  0.1 - 1.0 K/uL Final  . Eosinophils Relative 10/26/2018 2  % Final  . Eosinophils Absolute 10/26/2018 0.1  0.0 - 0.5 K/uL Final  . Basophils Relative 10/26/2018 2  % Final  . Basophils Absolute 10/26/2018 0.1  0.0 - 0.1 K/uL Final  .  Immature Granulocytes 10/26/2018 0  % Final  . Abs Immature Granulocytes 10/26/2018 0.02  0.00 - 0.07 K/uL Final   Performed at [facility], [address], [place], [place] [address]  . JC Virus DNA, PCR, Blood 10/26/2018 Negative  Negative Final   Comment: (NOTE) No JCV DNA detected This test was developed and its performance characteristics determined by [facility].  It has not been cleared or approved by the Food and Drug Administration.  The FDA has determined that such clearance or approval is not necessary. Performed At: [facility] [address] [place], [place] [id number] Jolene Schimke MD JI:[id number]     Assessment:  Jerry Bautista is a 43 y.o. male with multiple sclerosis diagnosed in 2000.  He presented with diplopia.  He previously received steroids, and Avonex/Rebif (interferon beta-1a).  He has been on Tysabri (last 09/28/2018)  Head MRI on 12/26/2017 revealed  no change moderate cerebral and cerebellar signal abnormalities consistent with the patient's history of multiple sclerosis. Many lesions demonstrate decreased T1 signal.  There was no change moderate atrophy.  There was no abnormal enhancement to confirm active demyelination. There was no MR evidence of PML.  Cervical and thoracic spine MRI on 12/26/2017 revealed mild/moderate patchy T2 signal abnormalities in the cervical and thoracic spinal cord, c/w the patient's history of multiple sclerosis. There was no abnormal enhancement to confirm active demyelination.  JC antibody was negative (index 0.10) on 11/23/2017.  Symptomatically, he is doing well.  Exam revealed decrease strength left interosseous muscles, difficulty with heel to toe walk, and 2++ reflexes left patella.  Plan: 1.   Labs today:  CBC with diff, CMP, JC virus testing. 2.   Multiple sclerosis  Discuss entire medical history, diagnosis and management of multiple sclerosis.  Discuss ongoing Tysabri  Potential side effects  reviewed.  Discuss JC virus testing.  Last testing 10/2017.  Review role of the infusion center.  Patient to follow-up with Dr. Gwendalyn Ege. 3.   Tysabri today. 4.   RTC every 4 weeks x 5 for Tysabri. 5.   RTC in 6 months for MD assessment, labs (CBC with diff, CMP, JC virus), and Tysabri.   Rosey Bath, MD, PhD  10/26/2018, 9:41 AM

## 2018-10-31 LAB — JC VIRUS DNA,PCR (WHOLE BLOOD): JC Virus DNA, PCR, Blood: NEGATIVE

## 2018-11-23 ENCOUNTER — Other Ambulatory Visit: Payer: Self-pay

## 2018-11-23 ENCOUNTER — Inpatient Hospital Stay: Payer: Medicare Other | Attending: Hematology and Oncology

## 2018-11-23 VITALS — BP 104/68 | HR 69 | Temp 97.1°F | Resp 18

## 2018-11-23 DIAGNOSIS — G35 Multiple sclerosis: Secondary | ICD-10-CM | POA: Diagnosis not present

## 2018-11-23 MED ORDER — ACETAMINOPHEN 500 MG PO TABS
1000.0000 mg | ORAL_TABLET | Freq: Once | ORAL | Status: AC
  Administered 2018-11-23: 1000 mg via ORAL
  Filled 2018-11-23: qty 2

## 2018-11-23 MED ORDER — SODIUM CHLORIDE 0.9 % IV SOLN
300.0000 mg | Freq: Once | INTRAVENOUS | Status: AC
  Administered 2018-11-23: 10:00:00 300 mg via INTRAVENOUS
  Filled 2018-11-23: qty 15

## 2018-11-23 MED ORDER — SODIUM CHLORIDE 0.9 % IV SOLN
Freq: Once | INTRAVENOUS | Status: AC
  Administered 2018-11-23: 10:00:00 via INTRAVENOUS
  Filled 2018-11-23: qty 250

## 2018-12-20 ENCOUNTER — Other Ambulatory Visit: Payer: Self-pay

## 2018-12-20 ENCOUNTER — Inpatient Hospital Stay: Payer: Medicare Other | Attending: Hematology and Oncology

## 2018-12-20 VITALS — BP 100/66 | HR 72 | Temp 96.8°F | Resp 16

## 2018-12-20 DIAGNOSIS — G35 Multiple sclerosis: Secondary | ICD-10-CM

## 2018-12-20 MED ORDER — SODIUM CHLORIDE 0.9 % IV SOLN
Freq: Once | INTRAVENOUS | Status: AC
  Administered 2018-12-20: 09:00:00 via INTRAVENOUS
  Filled 2018-12-20: qty 250

## 2018-12-20 MED ORDER — SODIUM CHLORIDE 0.9 % IV SOLN
300.0000 mg | Freq: Once | INTRAVENOUS | Status: AC
  Administered 2018-12-20: 09:00:00 300 mg via INTRAVENOUS
  Filled 2018-12-20: qty 15

## 2018-12-20 MED ORDER — ACETAMINOPHEN 500 MG PO TABS
1000.0000 mg | ORAL_TABLET | Freq: Once | ORAL | Status: AC
  Administered 2018-12-20: 09:00:00 1000 mg via ORAL
  Filled 2018-12-20: qty 2

## 2018-12-21 ENCOUNTER — Inpatient Hospital Stay: Payer: Medicare Other

## 2019-01-16 ENCOUNTER — Other Ambulatory Visit: Payer: Self-pay

## 2019-01-16 ENCOUNTER — Inpatient Hospital Stay: Payer: Medicare Other | Attending: Hematology and Oncology

## 2019-01-16 VITALS — BP 109/73 | Temp 96.3°F | Resp 18

## 2019-01-16 DIAGNOSIS — G35 Multiple sclerosis: Secondary | ICD-10-CM | POA: Diagnosis not present

## 2019-01-16 DIAGNOSIS — Z79899 Other long term (current) drug therapy: Secondary | ICD-10-CM | POA: Diagnosis not present

## 2019-01-16 MED ORDER — SODIUM CHLORIDE 0.9 % IV SOLN
300.0000 mg | Freq: Once | INTRAVENOUS | Status: AC
  Administered 2019-01-16: 10:00:00 300 mg via INTRAVENOUS
  Filled 2019-01-16: qty 15

## 2019-01-16 MED ORDER — SODIUM CHLORIDE 0.9 % IV SOLN
Freq: Once | INTRAVENOUS | Status: AC
  Administered 2019-01-16: 10:00:00 via INTRAVENOUS
  Filled 2019-01-16: qty 250

## 2019-01-16 MED ORDER — ACETAMINOPHEN 500 MG PO TABS
1000.0000 mg | ORAL_TABLET | Freq: Once | ORAL | Status: AC
  Administered 2019-01-16: 1000 mg via ORAL

## 2019-01-16 NOTE — Patient Instructions (Signed)
Natalizumab injection What is this medicine? NATALIZUMAB (na ta LIZ you mab) is used to treat relapsing multiple sclerosis. This drug is not a cure. It is also used to treat Crohn's disease. This medicine may be used for other purposes; ask your health care provider or pharmacist if you have questions. COMMON BRAND NAME(S): Tysabri What should I tell my health care provider before I take this medicine? They need to know if you have any of these conditions: -immune system problems -progressive multifocal leukoencephalopathy (PML) -an unusual or allergic reaction to natalizumab, other medicines, foods, dyes, or preservatives -pregnant or trying to get pregnant -breast-feeding How should I use this medicine? This medicine is for infusion into a vein. It is given by a health care professional in a hospital or clinic setting. A special MedGuide will be given to you by the pharmacist with each prescription and refill. Be sure to read this information carefully each time. Talk to your pediatrician regarding the use of this medicine in children. This medicine is not approved for use in children. Overdosage: If you think you have taken too much of this medicine contact a poison control center or emergency room at once. NOTE: This medicine is only for you. Do not share this medicine with others. What if I miss a dose? It is important not to miss your dose. Call your doctor or health care professional if you are unable to keep an appointment. What may interact with this medicine? -azathioprine -cyclosporine -interferon -6-mercaptopurine -methotrexate -steroid medicines like prednisone or cortisone -TNF-alpha inhibitors like adalimumab, etanercept, and infliximab -vaccines This list may not describe all possible interactions. Give your health care provider a list of all the medicines, herbs, non-prescription drugs, or dietary supplements you use. Also tell them if you smoke, drink alcohol, or use  illegal drugs. Some items may interact with your medicine. What should I watch for while using this medicine? Your condition will be monitored carefully while you are receiving this medicine. Visit your doctor for regular check ups. Tell your doctor or healthcare professional if your symptoms do not start to get better or if they get worse. Stay away from people who are sick. Call your doctor or health care professional for advice if you get a fever, chills or sore throat, or other symptoms of a cold or flu. Do not treat yourself. In some patients, this medicine may cause a serious brain infection that may cause death. If you have any problems seeing, thinking, speaking, walking, or standing, tell your doctor right away. If you cannot reach your doctor, get urgent medical care. What side effects may I notice from receiving this medicine? Side effects that you should report to your doctor or health care professional as soon as possible: -allergic reactions like skin rash, itching or hives, swelling of the face, lips, or tongue -breathing problems -changes in vision -chest pain -dark urine -depression, feelings of sadness -dizziness -general ill feeling or flu-like symptoms -irregular, missed, or painful menstrual periods -light-colored stools -loss of appetite, nausea -muscle weakness -problems with balance, talking, or walking -right upper belly pain -unusually weak or tired -yellowing of the eyes or skin Side effects that usually do not require medical attention (report to your doctor or health care professional if they continue or are bothersome): -aches, pains -headache -stomach upset -tiredness This list may not describe all possible side effects. Call your doctor for medical advice about side effects. You may report side effects to FDA at 1-800-FDA-1088. Where should I keep   my medicine? This drug is given in a hospital or clinic and will not be stored at home. NOTE: This sheet is  a summary. It may not cover all possible information. If you have questions about this medicine, talk to your doctor, pharmacist, or health care provider.  2019 Elsevier/Gold Standard (2008-09-06 13:33:21)  

## 2019-01-17 ENCOUNTER — Other Ambulatory Visit: Payer: Self-pay | Admitting: Hematology and Oncology

## 2019-01-17 ENCOUNTER — Telehealth: Payer: Self-pay

## 2019-01-17 DIAGNOSIS — G35 Multiple sclerosis: Secondary | ICD-10-CM

## 2019-01-17 NOTE — Telephone Encounter (Signed)
mailed order form for [facility] ( JCV w/index w reflex antibody) Per kelly ay [facility] / she also states she will inform him.

## 2019-01-18 ENCOUNTER — Inpatient Hospital Stay: Payer: Medicare Other

## 2019-02-15 ENCOUNTER — Other Ambulatory Visit: Payer: Self-pay

## 2019-02-15 ENCOUNTER — Inpatient Hospital Stay: Payer: Medicare Other | Attending: Hematology and Oncology

## 2019-02-15 VITALS — BP 106/71 | HR 81 | Temp 97.1°F | Resp 20

## 2019-02-15 DIAGNOSIS — G35 Multiple sclerosis: Secondary | ICD-10-CM | POA: Diagnosis not present

## 2019-02-15 MED ORDER — SODIUM CHLORIDE 0.9 % IV SOLN
Freq: Once | INTRAVENOUS | Status: AC
  Administered 2019-02-15: 10:00:00 via INTRAVENOUS
  Filled 2019-02-15: qty 250

## 2019-02-15 MED ORDER — ACETAMINOPHEN 500 MG PO TABS
1000.0000 mg | ORAL_TABLET | Freq: Once | ORAL | Status: AC
  Administered 2019-02-15: 1000 mg via ORAL

## 2019-02-15 MED ORDER — SODIUM CHLORIDE 0.9 % IV SOLN
300.0000 mg | Freq: Once | INTRAVENOUS | Status: AC
  Administered 2019-02-15: 300 mg via INTRAVENOUS
  Filled 2019-02-15: qty 15

## 2019-02-15 MED ORDER — ACETAMINOPHEN 500 MG PO TABS
ORAL_TABLET | ORAL | Status: AC
  Filled 2019-02-15: qty 2

## 2019-02-15 NOTE — Patient Instructions (Signed)
Natalizumab injection What is this medicine? NATALIZUMAB (na ta LIZ you mab) is used to treat relapsing multiple sclerosis. This drug is not a cure. It is also used to treat Crohn's disease. This medicine may be used for other purposes; ask your health care provider or pharmacist if you have questions. COMMON BRAND NAME(S): Tysabri What should I tell my health care provider before I take this medicine? They need to know if you have any of these conditions:  immune system problems  progressive multifocal leukoencephalopathy (PML)  an unusual or allergic reaction to natalizumab, other medicines, foods, dyes, or preservatives  pregnant or trying to get pregnant  breast-feeding How should I use this medicine? This medicine is for infusion into a vein. It is given by a health care professional in a hospital or clinic setting. A special MedGuide will be given to you by the pharmacist with each prescription and refill. Be sure to read this information carefully each time. Talk to your pediatrician regarding the use of this medicine in children. This medicine is not approved for use in children. Overdosage: If you think you have taken too much of this medicine contact a poison control center or emergency room at once. NOTE: This medicine is only for you. Do not share this medicine with others. What if I miss a dose? It is important not to miss your dose. Call your doctor or health care professional if you are unable to keep an appointment. What may interact with this medicine?  azathioprine  cyclosporine  interferon  6-mercaptopurine  methotrexate  steroid medicines like prednisone or cortisone  TNF-alpha inhibitors like adalimumab, etanercept, and infliximab  vaccines This list may not describe all possible interactions. Give your health care provider a list of all the medicines, herbs, non-prescription drugs, or dietary supplements you use. Also tell them if you smoke, drink  alcohol, or use illegal drugs. Some items may interact with your medicine. What should I watch for while using this medicine? Your condition will be monitored carefully while you are receiving this medicine. Visit your doctor for regular check ups. Tell your doctor or healthcare professional if your symptoms do not start to get better or if they get worse. Stay away from people who are sick. Call your doctor or health care professional for advice if you get a fever, chills or sore throat, or other symptoms of a cold or flu. Do not treat yourself. In some patients, this medicine may cause a serious brain infection that may cause death. If you have any problems seeing, thinking, speaking, walking, or standing, tell your doctor right away. If you cannot reach your doctor, get urgent medical care. What side effects may I notice from receiving this medicine? Side effects that you should report to your doctor or health care professional as soon as possible:  allergic reactions like skin rash, itching or hives, swelling of the face, lips, or tongue  breathing problems  changes in vision  chest pain  dark urine  depression, feelings of sadness  dizziness  general ill feeling or flu-like symptoms  irregular, missed, or painful menstrual periods  light-colored stools  loss of appetite, nausea  muscle weakness  problems with balance, talking, or walking  right upper belly pain  unusually weak or tired  yellowing of the eyes or skin Side effects that usually do not require medical attention (report to your doctor or health care professional if they continue or are bothersome):  aches, pains  headache  stomach upset    tiredness This list may not describe all possible side effects. Call your doctor for medical advice about side effects. You may report side effects to FDA at 1-800-FDA-1088. Where should I keep my medicine? This drug is given in a hospital or clinic and will not be  stored at home. NOTE: This sheet is a summary. It may not cover all possible information. If you have questions about this medicine, talk to your doctor, pharmacist, or health care provider.  2020 Elsevier/Gold Standard (2008-09-06 13:33:21)  

## 2019-03-14 ENCOUNTER — Other Ambulatory Visit: Payer: Self-pay

## 2019-03-15 ENCOUNTER — Inpatient Hospital Stay: Payer: Medicare Other | Attending: Hematology and Oncology

## 2019-03-15 VITALS — BP 107/75 | HR 66 | Temp 97.4°F | Resp 18

## 2019-03-15 DIAGNOSIS — G35 Multiple sclerosis: Secondary | ICD-10-CM | POA: Insufficient documentation

## 2019-03-15 MED ORDER — SODIUM CHLORIDE 0.9 % IV SOLN
Freq: Once | INTRAVENOUS | Status: AC
  Administered 2019-03-15: 10:00:00 via INTRAVENOUS
  Filled 2019-03-15: qty 250

## 2019-03-15 MED ORDER — SODIUM CHLORIDE 0.9 % IV SOLN
300.0000 mg | Freq: Once | INTRAVENOUS | Status: AC
  Administered 2019-03-15: 300 mg via INTRAVENOUS
  Filled 2019-03-15: qty 15

## 2019-03-15 MED ORDER — ACETAMINOPHEN 500 MG PO TABS
1000.0000 mg | ORAL_TABLET | Freq: Once | ORAL | Status: AC
  Administered 2019-03-15: 1000 mg via ORAL

## 2019-03-15 NOTE — Patient Instructions (Signed)
Natalizumab injection What is this medicine? NATALIZUMAB (na ta LIZ you mab) is used to treat relapsing multiple sclerosis. This drug is not a cure. It is also used to treat Crohn's disease. This medicine may be used for other purposes; ask your health care provider or pharmacist if you have questions. COMMON BRAND NAME(S): Tysabri What should I tell my health care provider before I take this medicine? They need to know if you have any of these conditions:  immune system problems  progressive multifocal leukoencephalopathy (PML)  an unusual or allergic reaction to natalizumab, other medicines, foods, dyes, or preservatives  pregnant or trying to get pregnant  breast-feeding How should I use this medicine? This medicine is for infusion into a vein. It is given by a health care professional in a hospital or clinic setting. A special MedGuide will be given to you by the pharmacist with each prescription and refill. Be sure to read this information carefully each time. Talk to your pediatrician regarding the use of this medicine in children. This medicine is not approved for use in children. Overdosage: If you think you have taken too much of this medicine contact a poison control center or emergency room at once. NOTE: This medicine is only for you. Do not share this medicine with others. What if I miss a dose? It is important not to miss your dose. Call your doctor or health care professional if you are unable to keep an appointment. What may interact with this medicine?  azathioprine  cyclosporine  interferon  6-mercaptopurine  methotrexate  steroid medicines like prednisone or cortisone  TNF-alpha inhibitors like adalimumab, etanercept, and infliximab  vaccines This list may not describe all possible interactions. Give your health care provider a list of all the medicines, herbs, non-prescription drugs, or dietary supplements you use. Also tell them if you smoke, drink  alcohol, or use illegal drugs. Some items may interact with your medicine. What should I watch for while using this medicine? Your condition will be monitored carefully while you are receiving this medicine. Visit your doctor for regular check ups. Tell your doctor or healthcare professional if your symptoms do not start to get better or if they get worse. Stay away from people who are sick. Call your doctor or health care professional for advice if you get a fever, chills or sore throat, or other symptoms of a cold or flu. Do not treat yourself. In some patients, this medicine may cause a serious brain infection that may cause death. If you have any problems seeing, thinking, speaking, walking, or standing, tell your doctor right away. If you cannot reach your doctor, get urgent medical care. What side effects may I notice from receiving this medicine? Side effects that you should report to your doctor or health care professional as soon as possible:  allergic reactions like skin rash, itching or hives, swelling of the face, lips, or tongue  breathing problems  changes in vision  chest pain  dark urine  depression, feelings of sadness  dizziness  general ill feeling or flu-like symptoms  irregular, missed, or painful menstrual periods  light-colored stools  loss of appetite, nausea  muscle weakness  problems with balance, talking, or walking  right upper belly pain  unusually weak or tired  yellowing of the eyes or skin Side effects that usually do not require medical attention (report to your doctor or health care professional if they continue or are bothersome):  aches, pains  headache  stomach upset    tiredness This list may not describe all possible side effects. Call your doctor for medical advice about side effects. You may report side effects to FDA at 1-800-FDA-1088. Where should I keep my medicine? This drug is given in a hospital or clinic and will not be  stored at home. NOTE: This sheet is a summary. It may not cover all possible information. If you have questions about this medicine, talk to your doctor, pharmacist, or health care provider.  2020 Elsevier/Gold Standard (2008-09-06 13:33:21)  

## 2019-04-11 NOTE — Progress Notes (Signed)
[facility]  [address] [place], [place] [address] Phone: [phone]  Fax: [phone]   Clinic Day:  04/12/2019  Referring physician: Selina Cooley, MD  Chief Complaint: Jerry Bautista is a 43 y.o. male with multiple sclerosis who is seen for 6 month assessment and continuation of Tysabri.  HPI: The patient was last seen in the hematology clinic on 10/26/2018. At that time, he was doing well.  Exam revealed decreasde strength left interosseous muscles, difficulty with heel to toe walk, and 2++ reflexes left patella.  He received Tysabri.  He has received Tysabri monthly, last 03/15/2019.   During the interim, he has felt "great".  He states that he is eating all the time.  Weight is up 6 pounds.  He states that he can tell when it is "time for an infusion".   Past Medical History:  Diagnosis Date  . Hypertension   . MS (multiple sclerosis) (HCC)     History reviewed. No pertinent surgical history.  History reviewed. No pertinent family history.  Social History:  reports that he has never smoked. He has never used smokeless tobacco. No history on file for alcohol and drug. He does not work.  He is on disability/retired in 1998.  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  He has 2 daughters.  He lives in [place].  The patient is alone today.  Allergies: No Known Allergies  Current Medications: Current Outpatient Medications  Medication Sig Dispense Refill  . baclofen (LIORESAL) 10 MG tablet Take 10 mg by mouth 2 (two) times a day.    . dalfampridine (AMPYRA) 10 MG TB12 Take 10 mg by mouth 2 (two) times daily.    . ferrous fumarate (HEMOCYTE - 106 MG FE) 325 (106 FE) MG TABS tablet Take 1 tablet by mouth.    Marland Kitchen lisinopril-hydrochlorothiazide (PRINZIDE,ZESTORETIC) 20-25 MG per tablet Take 1 tablet by mouth daily.    . Multiple Vitamin (MULTIVITAMIN) tablet Take 1 tablet by mouth daily.    . Omega-3 Fatty Acids  (FISH OIL) 1000 MG CAPS Take 1 capsule by mouth daily.     No current facility-administered medications for this visit.     Review of Systems  Constitutional: Negative.  Negative for chills, diaphoresis, fever, malaise/fatigue and weight loss (up 6 pounds).       Feels "great".  HENT: Negative.  Negative for congestion, ear pain, hearing loss, nosebleeds, sinus pain and sore throat.   Eyes: Negative.  Negative for blurred vision and double vision.  Respiratory: Negative.  Negative for cough, sputum production, shortness of breath and wheezing.   Cardiovascular: Negative.  Negative for chest pain, palpitations, orthopnea, leg swelling and PND.       Hypertension.  Gastrointestinal: Negative.  Negative for abdominal pain, blood in stool, constipation, diarrhea, melena, nausea and vomiting.  Genitourinary: Negative.  Negative for dysuria, frequency, hematuria and urgency.  Musculoskeletal: Negative.  Negative for back pain, falls, joint pain and myalgias.  Skin: Negative.  Negative for rash.  Neurological: Negative.  Negative for dizziness, tingling, sensory change, speech change, focal weakness, seizures and weakness.       Multiple sclerosis.  Endo/Heme/Allergies: Negative.  Does not bruise/bleed easily.  Psychiatric/Behavioral: Negative.  Negative for depression and memory loss. The patient is not nervous/anxious and does not have insomnia.   All other systems reviewed and are negative.  Performance status (ECOG): 1  Vitals Blood pressure 111/77, pulse 87, temperature (!) 96.4 F (35.8 C), temperature  source Tympanic, resp. rate 18, weight 198 lb 6.6 oz (90 kg), SpO2 100 %.   Physical Exam  Constitutional: He is oriented to person, place, and time. He appears well-developed and well-nourished. No distress.  HENT:  Head: Normocephalic and atraumatic.  Mouth/Throat: Oropharynx is clear and moist. No oropharyngeal exudate.  Wearing a [place] baseball cap.  Bluett black hair.  Mask.  Eyes:  Pupils are equal, round, and reactive to light. Conjunctivae and EOM are normal. No scleral icterus.  Brown eyes.  Neck: Normal range of motion. Neck supple. No JVD present.  Cardiovascular: Normal rate, regular rhythm and normal heart sounds.  No murmur heard. Pulmonary/Chest: Effort normal and breath sounds normal. No respiratory distress. He has no wheezes. He has no rales.  Abdominal: Soft. Bowel sounds are normal. He exhibits no distension. There is no abdominal tenderness. There is no rebound and no guarding.  Musculoskeletal: Normal range of motion.        General: No edema.  Lymphadenopathy:    He has no cervical adenopathy.    He has no axillary adenopathy.       Right: No supraclavicular adenopathy present.       Left: No supraclavicular adenopathy present.  Neurological: He is alert and oriented to person, place, and time.  Alert & oriented, cranial nerves II-XII intact; motor strength 5/5 throughout; sensation intact; finger to nose and RAM normal; 2+ patellar reflexes  Skin: Skin is warm and dry. No rash noted. He is not diaphoretic. No erythema. No pallor.  Tattoo.  Psychiatric: He has a normal mood and affect. His behavior is normal. Judgment and thought content normal.  Nursing note and vitals reviewed.   Appointment on 04/12/2019  Component Date Value Ref Range Status  . Sodium 04/12/2019 134* 135 - 145 mmol/L Final  . Potassium 04/12/2019 4.3  3.5 - 5.1 mmol/L Final  . Chloride 04/12/2019 101  98 - 111 mmol/L Final  . CO2 04/12/2019 25  22 - 32 mmol/L Final  . Glucose, Bld 04/12/2019 116* 70 - 99 mg/dL Final  . BUN 16/10/960409/06/2019 15  6 - 20 mg/dL Final  . Creatinine, Ser 04/12/2019 0.77  0.61 - 1.24 mg/dL Final  . Calcium 54/09/811909/06/2019 9.2  8.9 - 10.3 mg/dL Final  . Total Protein 04/12/2019 7.4  6.5 - 8.1 g/dL Final  . Albumin 14/78/295609/06/2019 4.1  3.5 - 5.0 g/dL Final  . AST 21/30/865709/06/2019 22  15 - 41 U/L Final  . ALT 04/12/2019 28  0 - 44 U/L Final  . Alkaline Phosphatase  04/12/2019 88  38 - 126 U/L Final  . Total Bilirubin 04/12/2019 0.8  0.3 - 1.2 mg/dL Final  . GFR calc non Af Amer 04/12/2019 >60  >60 mL/min Final  . GFR calc Af Amer 04/12/2019 >60  >60 mL/min Final  . Anion gap 04/12/2019 8  5 - 15 Final   Performed at [facility], [address] [place], [place] [id number]  . WBC 04/12/2019 7.6  4.0 - 10.5 K/uL Final  . RBC 04/12/2019 4.07* 4.22 - 5.81 MIL/uL Final  . Hemoglobin 04/12/2019 13.4  13.0 - 17.0 g/dL Final  . HCT 29/52/841309/06/2019 38.5* 39.0 - 52.0 % Final  . MCV 04/12/2019 94.6  80.0 - 100.0 fL Final  . MCH 04/12/2019 32.9  26.0 - 34.0 pg Final  . MCHC 04/12/2019 34.8  30.0 - 36.0 g/dL Final  . RDW 24/40/102709/06/2019 13.1  11.5 - 15.5 % Final  . Platelets 04/12/2019 247  150 - 400 K/uL Final  . nRBC 04/12/2019 1.4* 0.0 - 0.2 % Final  . Neutrophils Relative % 04/12/2019 48  % Final  . Neutro Abs 04/12/2019 3.6  1.7 - 7.7 K/uL Final  . Lymphocytes Relative 04/12/2019 38  % Final  . Lymphs Abs 04/12/2019 2.9  0.7 - 4.0 K/uL Final  . Monocytes Relative 04/12/2019 11  % Final  . Monocytes Absolute 04/12/2019 0.9  0.1 - 1.0 K/uL Final  . Eosinophils Relative 04/12/2019 2  % Final  . Eosinophils Absolute 04/12/2019 0.1  0.0 - 0.5 K/uL Final  . Basophils Relative 04/12/2019 1  % Final  . Basophils Absolute 04/12/2019 0.1  0.0 - 0.1 K/uL Final  . Immature Granulocytes 04/12/2019 0  % Final  . Abs Immature Granulocytes 04/12/2019 0.03  0.00 - 0.07 K/uL Final   Performed at [facility], [address], [place], [place] [address]    Assessment:  Jerry Bautista is a 43 y.o. male with multiple sclerosis diagnosed in 2000.  He presented with diplopia.  He previously received steroids, and Avonex/Rebif (interferon beta-1a).  He has been on Tysabri (last 03/15/2019)  Head MRI on 12/26/2017 revealed  no change moderate cerebral and cerebellar signal abnormalities consistent with the patient's history of multiple sclerosis. Many lesions  demonstrate decreased T1 signal.  There was no change moderate atrophy.  There was no abnormal enhancement to confirm active demyelination. There was no MR evidence of PML.  Cervical and thoracic spine MRI on 12/26/2017 revealed mild/moderate patchy T2 signal abnormalities in the cervical and thoracic spinal cord, c/w the patient's history of multiple sclerosis. There was no abnormal enhancement to confirm active demyelination.  JC antibody was negative (index 0.10) on 11/23/2017 and 0.23 (indeterminate) on 01/31/2019.  JCV antibody by inhibition was negative.  Symptomatically, he is doing well.  Exam is stable.  Plan: 1.   Labs today:  CBC with diff, CMP. 2.   Multiple sclerosis             Clinically, he is doing well.             Discuss interval JC virus testing.    Letter for approval of Tysabri administration dated 04/10/2019 was reviewed. 3.   Tysabri today. 4.   RTC every 4 weeks x 6 for Tysabri.   5.   Every 12 weeks add labs (CBC with diff, CMP).   6.   RTC in 24 weeks for MD assessment, labs (CBC with differential, CMP) and Tysabri.    I discussed the assessment and treatment plan with the patient.  The patient was provided an opportunity to ask questions and all were answered.  The patient agreed with the plan and demonstrated an understanding of the instructions.  The patient was advised to call back if the symptoms worsen or if the condition fails to improve as anticipated.   Lequita Asal, MD, PhD    04/12/2019, 3:35 PM

## 2019-04-12 ENCOUNTER — Other Ambulatory Visit: Payer: Self-pay

## 2019-04-12 ENCOUNTER — Inpatient Hospital Stay: Payer: Medicare Other

## 2019-04-12 ENCOUNTER — Inpatient Hospital Stay: Payer: Medicare Other | Attending: Hematology and Oncology | Admitting: Hematology and Oncology

## 2019-04-12 ENCOUNTER — Encounter: Payer: Self-pay | Admitting: Hematology and Oncology

## 2019-04-12 VITALS — BP 112/78 | HR 73 | Temp 97.1°F | Resp 16

## 2019-04-12 VITALS — BP 111/77 | HR 87 | Temp 96.4°F | Resp 18 | Wt 198.4 lb

## 2019-04-12 DIAGNOSIS — G35 Multiple sclerosis: Secondary | ICD-10-CM

## 2019-04-12 LAB — CBC WITH DIFFERENTIAL/PLATELET
Abs Immature Granulocytes: 0.03 10*3/uL (ref 0.00–0.07)
Basophils Absolute: 0.1 10*3/uL (ref 0.0–0.1)
Basophils Relative: 1 %
Eosinophils Absolute: 0.1 10*3/uL (ref 0.0–0.5)
Eosinophils Relative: 2 %
HCT: 38.5 % — ABNORMAL LOW (ref 39.0–52.0)
Hemoglobin: 13.4 g/dL (ref 13.0–17.0)
Immature Granulocytes: 0 %
Lymphocytes Relative: 38 %
Lymphs Abs: 2.9 10*3/uL (ref 0.7–4.0)
MCH: 32.9 pg (ref 26.0–34.0)
MCHC: 34.8 g/dL (ref 30.0–36.0)
MCV: 94.6 fL (ref 80.0–100.0)
Monocytes Absolute: 0.9 10*3/uL (ref 0.1–1.0)
Monocytes Relative: 11 %
Neutro Abs: 3.6 10*3/uL (ref 1.7–7.7)
Neutrophils Relative %: 48 %
Platelets: 247 10*3/uL (ref 150–400)
RBC: 4.07 MIL/uL — ABNORMAL LOW (ref 4.22–5.81)
RDW: 13.1 % (ref 11.5–15.5)
WBC: 7.6 10*3/uL (ref 4.0–10.5)
nRBC: 1.4 % — ABNORMAL HIGH (ref 0.0–0.2)

## 2019-04-12 LAB — COMPREHENSIVE METABOLIC PANEL
ALT: 28 U/L (ref 0–44)
AST: 22 U/L (ref 15–41)
Albumin: 4.1 g/dL (ref 3.5–5.0)
Alkaline Phosphatase: 88 U/L (ref 38–126)
Anion gap: 8 (ref 5–15)
BUN: 15 mg/dL (ref 6–20)
CO2: 25 mmol/L (ref 22–32)
Calcium: 9.2 mg/dL (ref 8.9–10.3)
Chloride: 101 mmol/L (ref 98–111)
Creatinine, Ser: 0.77 mg/dL (ref 0.61–1.24)
GFR calc Af Amer: >60 mL/min (ref >60)
GFR calc non Af Amer: >60 mL/min (ref >60)
Glucose, Bld: 116 mg/dL — ABNORMAL HIGH (ref 70–99)
Potassium: 4.3 mmol/L (ref 3.5–5.1)
Sodium: 134 mmol/L — ABNORMAL LOW (ref 135–145)
Total Bilirubin: 0.8 mg/dL (ref 0.3–1.2)
Total Protein: 7.4 g/dL (ref 6.5–8.1)

## 2019-04-12 MED ORDER — ACETAMINOPHEN 500 MG PO TABS
1000.0000 mg | ORAL_TABLET | Freq: Once | ORAL | Status: AC
  Administered 2019-04-12: 1000 mg via ORAL

## 2019-04-12 MED ORDER — SODIUM CHLORIDE 0.9 % IV SOLN
300.0000 mg | Freq: Once | INTRAVENOUS | Status: AC
  Administered 2019-04-12: 300 mg via INTRAVENOUS
  Filled 2019-04-12: qty 15

## 2019-04-12 MED ORDER — SODIUM CHLORIDE 0.9 % IV SOLN
Freq: Once | INTRAVENOUS | Status: AC
  Administered 2019-04-12: 12:00:00 via INTRAVENOUS
  Filled 2019-04-12: qty 250

## 2019-04-12 NOTE — Patient Instructions (Signed)
Natalizumab injection What is this medicine? NATALIZUMAB (na ta LIZ you mab) is used to treat relapsing multiple sclerosis. This drug is not a cure. It is also used to treat Crohn's disease. This medicine may be used for other purposes; ask your health care provider or pharmacist if you have questions. COMMON BRAND NAME(S): Tysabri What should I tell my health care provider before I take this medicine? They need to know if you have any of these conditions:  immune system problems  progressive multifocal leukoencephalopathy (PML)  an unusual or allergic reaction to natalizumab, other medicines, foods, dyes, or preservatives  pregnant or trying to get pregnant  breast-feeding How should I use this medicine? This medicine is for infusion into a vein. It is given by a health care professional in a hospital or clinic setting. A special MedGuide will be given to you by the pharmacist with each prescription and refill. Be sure to read this information carefully each time. Talk to your pediatrician regarding the use of this medicine in children. This medicine is not approved for use in children. Overdosage: If you think you have taken too much of this medicine contact a poison control center or emergency room at once. NOTE: This medicine is only for you. Do not share this medicine with others. What if I miss a dose? It is important not to miss your dose. Call your doctor or health care professional if you are unable to keep an appointment. What may interact with this medicine?  azathioprine  cyclosporine  interferon  6-mercaptopurine  methotrexate  steroid medicines like prednisone or cortisone  TNF-alpha inhibitors like adalimumab, etanercept, and infliximab  vaccines This list may not describe all possible interactions. Give your health care provider a list of all the medicines, herbs, non-prescription drugs, or dietary supplements you use. Also tell them if you smoke, drink  alcohol, or use illegal drugs. Some items may interact with your medicine. What should I watch for while using this medicine? Your condition will be monitored carefully while you are receiving this medicine. Visit your doctor for regular check ups. Tell your doctor or healthcare professional if your symptoms do not start to get better or if they get worse. Stay away from people who are sick. Call your doctor or health care professional for advice if you get a fever, chills or sore throat, or other symptoms of a cold or flu. Do not treat yourself. In some patients, this medicine may cause a serious brain infection that may cause death. If you have any problems seeing, thinking, speaking, walking, or standing, tell your doctor right away. If you cannot reach your doctor, get urgent medical care. What side effects may I notice from receiving this medicine? Side effects that you should report to your doctor or health care professional as soon as possible:  allergic reactions like skin rash, itching or hives, swelling of the face, lips, or tongue  breathing problems  changes in vision  chest pain  dark urine  depression, feelings of sadness  dizziness  general ill feeling or flu-like symptoms  irregular, missed, or painful menstrual periods  light-colored stools  loss of appetite, nausea  muscle weakness  problems with balance, talking, or walking  right upper belly pain  unusually weak or tired  yellowing of the eyes or skin Side effects that usually do not require medical attention (report to your doctor or health care professional if they continue or are bothersome):  aches, pains  headache  stomach upset    tiredness This list may not describe all possible side effects. Call your doctor for medical advice about side effects. You may report side effects to FDA at 1-800-FDA-1088. Where should I keep my medicine? This drug is given in a hospital or clinic and will not be  stored at home. NOTE: This sheet is a summary. It may not cover all possible information. If you have questions about this medicine, talk to your doctor, pharmacist, or health care provider.  2020 Elsevier/Gold Standard (2008-09-06 13:33:21)  

## 2019-04-18 ENCOUNTER — Other Ambulatory Visit: Payer: Medicare Other

## 2019-04-18 ENCOUNTER — Ambulatory Visit: Payer: Medicare Other | Admitting: Hematology and Oncology

## 2019-04-18 ENCOUNTER — Ambulatory Visit: Payer: Medicare Other

## 2019-04-24 ENCOUNTER — Encounter: Payer: Self-pay | Admitting: Neurology

## 2019-05-09 ENCOUNTER — Other Ambulatory Visit: Payer: Self-pay

## 2019-05-09 ENCOUNTER — Encounter: Payer: Self-pay | Admitting: Pharmacy Technician

## 2019-05-09 NOTE — Progress Notes (Signed)
Patient has PAN co pay assistance effective dates 05/08/19-05/06/20 ID #[id number] for $5600.

## 2019-05-10 ENCOUNTER — Inpatient Hospital Stay: Payer: Medicare Other | Attending: Hematology and Oncology

## 2019-05-10 ENCOUNTER — Encounter: Payer: Self-pay | Admitting: Pharmacy Technician

## 2019-05-10 VITALS — BP 109/73 | HR 69 | Temp 97.0°F | Resp 18

## 2019-05-10 DIAGNOSIS — G35 Multiple sclerosis: Secondary | ICD-10-CM | POA: Insufficient documentation

## 2019-05-10 MED ORDER — SODIUM CHLORIDE 0.9 % IV SOLN
Freq: Once | INTRAVENOUS | Status: AC
  Administered 2019-05-10: 09:00:00 via INTRAVENOUS
  Filled 2019-05-10: qty 250

## 2019-05-10 MED ORDER — SODIUM CHLORIDE 0.9 % IV SOLN
300.0000 mg | Freq: Once | INTRAVENOUS | Status: AC
  Administered 2019-05-10: 300 mg via INTRAVENOUS
  Filled 2019-05-10: qty 15

## 2019-05-10 MED ORDER — ACETAMINOPHEN 500 MG PO TABS
1000.0000 mg | ORAL_TABLET | Freq: Once | ORAL | Status: AC
  Administered 2019-05-10: 1000 mg via ORAL

## 2019-05-10 NOTE — Progress Notes (Signed)
Patient has been approved for drug assistance by [employer] for Tysabri. The enrollment period is from 03/19/19-08/01/19 based on manufacturer supply. First DOS covered is 04/12/2019.

## 2019-06-06 ENCOUNTER — Other Ambulatory Visit: Payer: Self-pay

## 2019-06-07 ENCOUNTER — Inpatient Hospital Stay: Payer: Medicare Other | Attending: Hematology and Oncology

## 2019-06-07 VITALS — BP 112/75 | HR 88 | Temp 97.2°F | Resp 18

## 2019-06-07 DIAGNOSIS — G35 Multiple sclerosis: Secondary | ICD-10-CM | POA: Insufficient documentation

## 2019-06-07 MED ORDER — ACETAMINOPHEN 500 MG PO TABS
1000.0000 mg | ORAL_TABLET | Freq: Once | ORAL | Status: AC
  Administered 2019-06-07: 1000 mg via ORAL

## 2019-06-07 MED ORDER — SODIUM CHLORIDE 0.9 % IV SOLN
Freq: Once | INTRAVENOUS | Status: AC
  Administered 2019-06-07: 09:00:00 via INTRAVENOUS
  Filled 2019-06-07: qty 250

## 2019-06-07 MED ORDER — SODIUM CHLORIDE 0.9 % IV SOLN
300.0000 mg | Freq: Once | INTRAVENOUS | Status: AC
  Administered 2019-06-07: 300 mg via INTRAVENOUS
  Filled 2019-06-07: qty 15

## 2019-06-07 NOTE — Patient Instructions (Signed)
Natalizumab injection What is this medicine? NATALIZUMAB (na ta LIZ you mab) is used to treat relapsing multiple sclerosis. This drug is not a cure. It is also used to treat Crohn's disease. This medicine may be used for other purposes; ask your health care provider or pharmacist if you have questions. COMMON BRAND NAME(S): Tysabri What should I tell my health care provider before I take this medicine? They need to know if you have any of these conditions:  immune system problems  progressive multifocal leukoencephalopathy (PML)  an unusual or allergic reaction to natalizumab, other medicines, foods, dyes, or preservatives  pregnant or trying to get pregnant  breast-feeding How should I use this medicine? This medicine is for infusion into a vein. It is given by a health care professional in a hospital or clinic setting. A special MedGuide will be given to you by the pharmacist with each prescription and refill. Be sure to read this information carefully each time. Talk to your pediatrician regarding the use of this medicine in children. This medicine is not approved for use in children. Overdosage: If you think you have taken too much of this medicine contact a poison control center or emergency room at once. NOTE: This medicine is only for you. Do not share this medicine with others. What if I miss a dose? It is important not to miss your dose. Call your doctor or health care professional if you are unable to keep an appointment. What may interact with this medicine?  azathioprine  cyclosporine  interferon  6-mercaptopurine  methotrexate  steroid medicines like prednisone or cortisone  TNF-alpha inhibitors like adalimumab, etanercept, and infliximab  vaccines This list may not describe all possible interactions. Give your health care provider a list of all the medicines, herbs, non-prescription drugs, or dietary supplements you use. Also tell them if you smoke, drink  alcohol, or use illegal drugs. Some items may interact with your medicine. What should I watch for while using this medicine? Your condition will be monitored carefully while you are receiving this medicine. Visit your doctor for regular check ups. Tell your doctor or healthcare professional if your symptoms do not start to get better or if they get worse. Stay away from people who are sick. Call your doctor or health care professional for advice if you get a fever, chills or sore throat, or other symptoms of a cold or flu. Do not treat yourself. In some patients, this medicine may cause a serious brain infection that may cause death. If you have any problems seeing, thinking, speaking, walking, or standing, tell your doctor right away. If you cannot reach your doctor, get urgent medical care. What side effects may I notice from receiving this medicine? Side effects that you should report to your doctor or health care professional as soon as possible:  allergic reactions like skin rash, itching or hives, swelling of the face, lips, or tongue  breathing problems  changes in vision  chest pain  dark urine  depression, feelings of sadness  dizziness  general ill feeling or flu-like symptoms  irregular, missed, or painful menstrual periods  light-colored stools  loss of appetite, nausea  muscle weakness  problems with balance, talking, or walking  right upper belly pain  unusually weak or tired  yellowing of the eyes or skin Side effects that usually do not require medical attention (report to your doctor or health care professional if they continue or are bothersome):  aches, pains  headache  stomach upset    tiredness This list may not describe all possible side effects. Call your doctor for medical advice about side effects. You may report side effects to FDA at 1-800-FDA-1088. Where should I keep my medicine? This drug is given in a hospital or clinic and will not be  stored at home. NOTE: This sheet is a summary. It may not cover all possible information. If you have questions about this medicine, talk to your doctor, pharmacist, or health care provider.  2020 Elsevier/Gold Standard (2008-09-06 13:33:21)  

## 2019-07-05 ENCOUNTER — Inpatient Hospital Stay: Payer: Medicare Other

## 2019-07-05 ENCOUNTER — Inpatient Hospital Stay: Payer: Medicare Other | Attending: Hematology and Oncology

## 2019-07-05 ENCOUNTER — Other Ambulatory Visit: Payer: Self-pay

## 2019-07-05 VITALS — BP 111/65 | HR 91 | Temp 98.4°F | Resp 17

## 2019-07-05 DIAGNOSIS — G35 Multiple sclerosis: Secondary | ICD-10-CM | POA: Insufficient documentation

## 2019-07-05 LAB — CBC WITH DIFFERENTIAL/PLATELET
Abs Immature Granulocytes: 0.02 10*3/uL (ref 0.00–0.07)
Basophils Absolute: 0.1 10*3/uL (ref 0.0–0.1)
Basophils Relative: 1 %
Eosinophils Absolute: 0.1 10*3/uL (ref 0.0–0.5)
Eosinophils Relative: 2 %
HCT: 39.4 % (ref 39.0–52.0)
Hemoglobin: 13.8 g/dL (ref 13.0–17.0)
Immature Granulocytes: 0 %
Lymphocytes Relative: 39 %
Lymphs Abs: 3.2 10*3/uL (ref 0.7–4.0)
MCH: 32.6 pg (ref 26.0–34.0)
MCHC: 35 g/dL (ref 30.0–36.0)
MCV: 93.1 fL (ref 80.0–100.0)
Monocytes Absolute: 1 10*3/uL (ref 0.1–1.0)
Monocytes Relative: 13 %
Neutro Abs: 3.7 10*3/uL (ref 1.7–7.7)
Neutrophils Relative %: 45 %
Platelets: 251 10*3/uL (ref 150–400)
RBC: 4.23 MIL/uL (ref 4.22–5.81)
RDW: 12.8 % (ref 11.5–15.5)
WBC: 8.1 10*3/uL (ref 4.0–10.5)
nRBC: 0.9 % — ABNORMAL HIGH (ref 0.0–0.2)

## 2019-07-05 LAB — COMPREHENSIVE METABOLIC PANEL
ALT: 28 U/L (ref 0–44)
AST: 21 U/L (ref 15–41)
Albumin: 4.3 g/dL (ref 3.5–5.0)
Alkaline Phosphatase: 87 U/L (ref 38–126)
Anion gap: 5 (ref 5–15)
BUN: 19 mg/dL (ref 6–20)
CO2: 26 mmol/L (ref 22–32)
Calcium: 9 mg/dL (ref 8.9–10.3)
Chloride: 101 mmol/L (ref 98–111)
Creatinine, Ser: 0.84 mg/dL (ref 0.61–1.24)
GFR calc Af Amer: >60 mL/min (ref >60)
GFR calc non Af Amer: >60 mL/min (ref >60)
Glucose, Bld: 99 mg/dL (ref 70–99)
Potassium: 4.3 mmol/L (ref 3.5–5.1)
Sodium: 132 mmol/L — ABNORMAL LOW (ref 135–145)
Total Bilirubin: 1.1 mg/dL (ref 0.3–1.2)
Total Protein: 7.8 g/dL (ref 6.5–8.1)

## 2019-07-05 MED ORDER — SODIUM CHLORIDE 0.9 % IV SOLN
Freq: Once | INTRAVENOUS | Status: AC
  Administered 2019-07-05: 10:00:00 via INTRAVENOUS
  Filled 2019-07-05: qty 250

## 2019-07-05 MED ORDER — SODIUM CHLORIDE 0.9 % IV SOLN
300.0000 mg | Freq: Once | INTRAVENOUS | Status: AC
  Administered 2019-07-05: 10:00:00 300 mg via INTRAVENOUS
  Filled 2019-07-05: qty 15

## 2019-07-05 MED ORDER — ACETAMINOPHEN 500 MG PO TABS
1000.0000 mg | ORAL_TABLET | Freq: Once | ORAL | Status: AC
  Administered 2019-07-05: 1000 mg via ORAL

## 2019-07-05 NOTE — Patient Instructions (Signed)
Natalizumab injection What is this medicine? NATALIZUMAB (na ta LIZ you mab) is used to treat relapsing multiple sclerosis. This drug is not a cure. It is also used to treat Crohn's disease. This medicine may be used for other purposes; ask your health care provider or pharmacist if you have questions. COMMON BRAND NAME(S): Tysabri What should I tell my health care provider before I take this medicine? They need to know if you have any of these conditions:  immune system problems  progressive multifocal leukoencephalopathy (PML)  an unusual or allergic reaction to natalizumab, other medicines, foods, dyes, or preservatives  pregnant or trying to get pregnant  breast-feeding How should I use this medicine? This medicine is for infusion into a vein. It is given by a health care professional in a hospital or clinic setting. A special MedGuide will be given to you by the pharmacist with each prescription and refill. Be sure to read this information carefully each time. Talk to your pediatrician regarding the use of this medicine in children. This medicine is not approved for use in children. Overdosage: If you think you have taken too much of this medicine contact a poison control center or emergency room at once. NOTE: This medicine is only for you. Do not share this medicine with others. What if I miss a dose? It is important not to miss your dose. Call your doctor or health care professional if you are unable to keep an appointment. What may interact with this medicine?  azathioprine  cyclosporine  interferon  6-mercaptopurine  methotrexate  steroid medicines like prednisone or cortisone  TNF-alpha inhibitors like adalimumab, etanercept, and infliximab  vaccines This list may not describe all possible interactions. Give your health care provider a list of all the medicines, herbs, non-prescription drugs, or dietary supplements you use. Also tell them if you smoke, drink  alcohol, or use illegal drugs. Some items may interact with your medicine. What should I watch for while using this medicine? Your condition will be monitored carefully while you are receiving this medicine. Visit your doctor for regular check ups. Tell your doctor or healthcare professional if your symptoms do not start to get better or if they get worse. Stay away from people who are sick. Call your doctor or health care professional for advice if you get a fever, chills or sore throat, or other symptoms of a cold or flu. Do not treat yourself. In some patients, this medicine may cause a serious brain infection that may cause death. If you have any problems seeing, thinking, speaking, walking, or standing, tell your doctor right away. If you cannot reach your doctor, get urgent medical care. What side effects may I notice from receiving this medicine? Side effects that you should report to your doctor or health care professional as soon as possible:  allergic reactions like skin rash, itching or hives, swelling of the face, lips, or tongue  breathing problems  changes in vision  chest pain  dark urine  depression, feelings of sadness  dizziness  general ill feeling or flu-like symptoms  irregular, missed, or painful menstrual periods  light-colored stools  loss of appetite, nausea  muscle weakness  problems with balance, talking, or walking  right upper belly pain  unusually weak or tired  yellowing of the eyes or skin Side effects that usually do not require medical attention (report to your doctor or health care professional if they continue or are bothersome):  aches, pains  headache  stomach upset    tiredness This list may not describe all possible side effects. Call your doctor for medical advice about side effects. You may report side effects to FDA at 1-800-FDA-1088. Where should I keep my medicine? This drug is given in a hospital or clinic and will not be  stored at home. NOTE: This sheet is a summary. It may not cover all possible information. If you have questions about this medicine, talk to your doctor, pharmacist, or health care provider.  2020 Elsevier/Gold Standard (2008-09-06 13:33:21)  

## 2019-08-01 ENCOUNTER — Other Ambulatory Visit: Payer: Self-pay

## 2019-08-05 ENCOUNTER — Inpatient Hospital Stay: Payer: Medicare Other | Attending: Hematology and Oncology

## 2019-08-05 ENCOUNTER — Other Ambulatory Visit: Payer: Self-pay

## 2019-08-05 VITALS — BP 116/74 | HR 90 | Resp 18

## 2019-08-05 DIAGNOSIS — G35 Multiple sclerosis: Secondary | ICD-10-CM | POA: Diagnosis present

## 2019-08-05 MED ORDER — SODIUM CHLORIDE 0.9 % IV SOLN
300.0000 mg | Freq: Once | INTRAVENOUS | Status: AC
  Administered 2019-08-05: 10:00:00 300 mg via INTRAVENOUS
  Filled 2019-08-05: qty 15

## 2019-08-05 MED ORDER — ACETAMINOPHEN 500 MG PO TABS
1000.0000 mg | ORAL_TABLET | Freq: Once | ORAL | Status: AC
  Administered 2019-08-05: 09:00:00 1000 mg via ORAL
  Filled 2019-08-05: qty 2

## 2019-08-05 MED ORDER — SODIUM CHLORIDE 0.9 % IV SOLN
Freq: Once | INTRAVENOUS | Status: AC
  Filled 2019-08-05: qty 250

## 2019-08-26 ENCOUNTER — Encounter: Payer: Self-pay | Admitting: Pharmacy Technician

## 2019-08-26 NOTE — Progress Notes (Signed)
Patient no longer getting Tysabri from [employer] based on new insurance coverage. Last DOS covered is 08/05/19.

## 2019-09-02 ENCOUNTER — Other Ambulatory Visit: Payer: Self-pay

## 2019-09-02 ENCOUNTER — Inpatient Hospital Stay: Payer: Medicare Other | Attending: Hematology and Oncology

## 2019-09-02 VITALS — BP 107/73 | HR 69 | Temp 97.2°F | Resp 17

## 2019-09-02 DIAGNOSIS — G35 Multiple sclerosis: Secondary | ICD-10-CM

## 2019-09-02 MED ORDER — SODIUM CHLORIDE 0.9 % IV SOLN
Freq: Once | INTRAVENOUS | Status: AC
  Filled 2019-09-02: qty 250

## 2019-09-02 MED ORDER — SODIUM CHLORIDE 0.9 % IV SOLN
300.0000 mg | Freq: Once | INTRAVENOUS | Status: AC
  Administered 2019-09-02: 10:00:00 300 mg via INTRAVENOUS
  Filled 2019-09-02: qty 15

## 2019-09-02 MED ORDER — ACETAMINOPHEN 500 MG PO TABS
1000.0000 mg | ORAL_TABLET | Freq: Once | ORAL | Status: AC
  Administered 2019-09-02: 1000 mg via ORAL

## 2019-09-02 NOTE — Patient Instructions (Signed)
Natalizumab injection What is this medicine? NATALIZUMAB (na ta LIZ you mab) is used to treat relapsing multiple sclerosis. This drug is not a cure. It is also used to treat Crohn's disease. This medicine may be used for other purposes; ask your health care provider or pharmacist if you have questions. COMMON BRAND NAME(S): Tysabri What should I tell my health care provider before I take this medicine? They need to know if you have any of these conditions:  immune system problems  progressive multifocal leukoencephalopathy (PML)  an unusual or allergic reaction to natalizumab, other medicines, foods, dyes, or preservatives  pregnant or trying to get pregnant  breast-feeding How should I use this medicine? This medicine is for infusion into a vein. It is given by a health care professional in a hospital or clinic setting. A special MedGuide will be given to you by the pharmacist with each prescription and refill. Be sure to read this information carefully each time. Talk to your pediatrician regarding the use of this medicine in children. This medicine is not approved for use in children. Overdosage: If you think you have taken too much of this medicine contact a poison control center or emergency room at once. NOTE: This medicine is only for you. Do not share this medicine with others. What if I miss a dose? It is important not to miss your dose. Call your doctor or health care professional if you are unable to keep an appointment. What may interact with this medicine? Do not take this medicine with any of the following medications:  biologic medicines such as adalimumab, certolizumab, etanercept, golimumab, infliximab This medicine may also interact with the following medications:  azathioprine  cyclosporine  interferons  6-mercaptopurine  methotrexate  other medicines that lower your chance of fighting an infection  steroid medicines like prednisone or  cortisone  vaccines This list may not describe all possible interactions. Give your health care provider a list of all the medicines, herbs, non-prescription drugs, or dietary supplements you use. Also tell them if you smoke, drink alcohol, or use illegal drugs. Some items may interact with your medicine. What should I watch for while using this medicine? Your condition will be monitored carefully while you are receiving this medicine. Visit your doctor for regular check ups. Tell your doctor or healthcare professional if your symptoms do not start to get better or if they get worse. Stay away from people who are sick. Call your doctor or health care professional for advice if you get a fever, chills or sore throat, or other symptoms of a cold or flu. Do not treat yourself. In some patients, this medicine may cause a serious brain infection that may cause death. If you have any problems seeing, thinking, speaking, walking, or standing, tell your doctor right away. If you cannot reach your doctor, get urgent medical care. What side effects may I notice from receiving this medicine? Side effects that you should report to your doctor or health care professional as soon as possible:  allergic reactions like skin rash, itching or hives, swelling of the face, lips, or tongue  breathing problems  changes in vision  chest pain  confusion  depressed mood  dizziness  feeling faint; lightheaded; falls  general ill feeling or flu-like symptoms  loss of memory  missed menstrual periods  muscle weakness  problems with balance, talking, or walking  signs and symptoms of liver injury like dark yellow or brown urine; general ill feeling or flu-like symptoms; light-colored   stools; loss of appetite; nausea; right upper belly pain; unusually weak or tired; yellowing of the eyes or skin  suicidal thoughts, mood changes  unusual bruising or bleeding  unusually weak or tired Side effects that  usually do not require medical attention (report to your doctor or health care professional if they continue or are bothersome):  headache  joint pain  muscle cramps  muscle pain  nausea, vomiting  pain, redness, or irritation at site where injected  tiredness This list may not describe all possible side effects. Call your doctor for medical advice about side effects. You may report side effects to FDA at 1-800-FDA-1088. Where should I keep my medicine? This drug is given in a hospital or clinic and will not be stored at home. NOTE: This sheet is a summary. It may not cover all possible information. If you have questions about this medicine, talk to your doctor, pharmacist, or health care provider.  2020 Elsevier/Gold Standard (2019-01-21 13:20:26)  

## 2019-09-26 NOTE — Progress Notes (Signed)
[facility]  [address] [place], [place] [address] Phone: [phone]  Fax: [phone]   Clinic Day:  09/30/2019  Referring physician: Selina Cooley, MD  Chief Complaint: Jerry Bautista is a 44 y.o. male with multiple sclerosis who is seen for a 6 month assessment on Tysabri.  HPI: The patient was last seen in the hematology clinic on 04/12/2019. At that time, he was doing well. Exam was stable. Hematocrit 38.5, hemoglobin 13.4, platelets 247,000, WBC 7,600. Sodium was 134. Patient received Tysabri 300 mg.   Patient received Tysabri monthly x 5 (05/10/2019 - 09/02/2019).   Labs on 07/05/2019: Hematocrit 39.4, hemoglobin 13.8, platelets 251,000, WBC 8,100. Sodium was 132.   During the interim, he has felt "good". He reports his MS symptoms are about the same.  He denies any focal numbness, weakness, balance or coordination issues.  He denies any falls or visual changes. He is interested in the COVID-19vaccine.    Past Medical History:  Diagnosis Date  . Hypertension   . MS (multiple sclerosis) (HCC)     History reviewed. No pertinent surgical history.  History reviewed. No pertinent family history.  Social History:  reports that he has never smoked. He has never used smokeless tobacco. No history on file for alcohol and drug. He does not work. He is on disability/retired in 1998. 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 He has 2 daughters. He lives in [place]. The patient is alone today.  Allergies: No Known Allergies  Current Medications: Current Outpatient Medications  Medication Sig Dispense Refill  . baclofen (LIORESAL) 10 MG tablet Take 10 mg by mouth 2 (two) times a day.    . dalfampridine (AMPYRA) 10 MG TB12 Take 10 mg by mouth 2 (two) times daily.    . ferrous fumarate (HEMOCYTE - 106 MG FE) 325 (106 FE) MG TABS tablet Take 1 tablet by mouth.    Marland Kitchen lisinopril (ZESTRIL) 10 MG tablet Take by mouth.     . Multiple Vitamin (MULTIVITAMIN) tablet Take 1 tablet by mouth daily.    . Omega-3 Fatty Acids (FISH OIL) 1000 MG CAPS Take 1 capsule by mouth daily.     No current facility-administered medications for this visit.    Review of Systems  Constitutional: Negative for chills, diaphoresis, fever, malaise/fatigue and weight loss (up 13 lbs).       Feels "good".  HENT: Negative.  Negative for congestion, ear pain, hearing loss, nosebleeds, sinus pain and sore throat.   Eyes: Negative.  Negative for blurred vision and double vision.  Respiratory: Negative.  Negative for cough, sputum production, shortness of breath and wheezing.   Cardiovascular: Negative.  Negative for chest pain, palpitations, orthopnea, leg swelling and PND.       Hypertension.  Gastrointestinal: Negative.  Negative for abdominal pain, blood in stool, constipation, diarrhea, melena, nausea and vomiting.  Genitourinary: Negative.  Negative for dysuria, frequency, hematuria and urgency.  Musculoskeletal: Negative.  Negative for back pain, falls, joint pain and myalgias.  Skin: Negative.  Negative for rash.  Neurological: Negative.  Negative for dizziness, tingling, sensory change, speech change, focal weakness, seizures and weakness.       Multiple sclerosis.  Endo/Heme/Allergies: Negative.  Does not bruise/bleed easily.  Psychiatric/Behavioral: Negative.  Negative for depression and memory loss. The patient is not nervous/anxious and does not have insomnia.   All other systems reviewed and are negative.  Performance status (ECOG): 1  Vitals Blood pressure 108/83, pulse 99,  temperature (!) 97 F (36.1 C), temperature source Tympanic, resp. rate 18, weight 211 lb 7.4 oz (95.9 kg), SpO2 97 %.   Physical Exam  Constitutional: He is oriented to person, place, and time. He appears well-developed and well-nourished. No distress.  HENT:  Head: Normocephalic and atraumatic.  Mouth/Throat: Oropharynx is clear and moist. No  oropharyngeal exudate.  Baseball cap.  Manlove black hair.  Mask.  Eyes: Pupils are equal, round, and reactive to light. Conjunctivae and EOM are normal. No scleral icterus.  Brown eyes.  Neck: No JVD present.  Cardiovascular: Normal rate, regular rhythm and normal heart sounds.  No murmur heard. Pulmonary/Chest: Effort normal and breath sounds normal. No respiratory distress. He has no wheezes. He has no rales.  Abdominal: Soft. Bowel sounds are normal. He exhibits no distension. There is no abdominal tenderness. There is no rebound and no guarding.  Musculoskeletal:        General: No edema. Normal range of motion.     Cervical back: Normal range of motion and neck supple.  Lymphadenopathy:       Head (right side): No preauricular, no posterior auricular and no occipital adenopathy present.       Head (left side): No preauricular, no posterior auricular and no occipital adenopathy present.    He has no cervical adenopathy.    He has no axillary adenopathy.       Right: No inguinal and no supraclavicular adenopathy present.       Left: No inguinal and no supraclavicular adenopathy present.  Neurological: He is alert and oriented to person, place, and time.  Alert & oriented, cranial nerves II-XII intact; motor strength 5/5 throughout; sensation intact; finger to nose and RAM normal; 2+ patellar reflexes  Skin: Skin is warm and dry. No rash noted. He is not diaphoretic. No erythema. No pallor.  Psychiatric: He has a normal mood and affect. His behavior is normal. Judgment and thought content normal.  Nursing note and vitals reviewed.   Appointment on 09/30/2019  Component Date Value Ref Range Status  . WBC 09/30/2019 6.6  4.0 - 10.5 K/uL Final  . RBC 09/30/2019 4.33  4.22 - 5.81 MIL/uL Final  . Hemoglobin 09/30/2019 14.0  13.0 - 17.0 g/dL Final  . HCT 09/30/2019 40.2  39.0 - 52.0 % Final  . MCV 09/30/2019 92.8  80.0 - 100.0 fL Final  . MCH 09/30/2019 32.3  26.0 - 34.0 pg Final  . MCHC  09/30/2019 34.8  30.0 - 36.0 g/dL Final  . RDW 09/30/2019 12.7  11.5 - 15.5 % Final  . Platelets 09/30/2019 270  150 - 400 K/uL Final  . nRBC 09/30/2019 0.9* 0.0 - 0.2 % Final  . Neutrophils Relative % 09/30/2019 43  % Final  . Neutro Abs 09/30/2019 2.9  1.7 - 7.7 K/uL Final  . Lymphocytes Relative 09/30/2019 41  % Final  . Lymphs Abs 09/30/2019 2.7  0.7 - 4.0 K/uL Final  . Monocytes Relative 09/30/2019 13  % Final  . Monocytes Absolute 09/30/2019 0.9  0.1 - 1.0 K/uL Final  . Eosinophils Relative 09/30/2019 2  % Final  . Eosinophils Absolute 09/30/2019 0.1  0.0 - 0.5 K/uL Final  . Basophils Relative 09/30/2019 1  % Final  . Basophils Absolute 09/30/2019 0.1  0.0 - 0.1 K/uL Final  . Immature Granulocytes 09/30/2019 0  % Final  . Abs Immature Granulocytes 09/30/2019 0.01  0.00 - 0.07 K/uL Final   Performed at [facility],  [address] [place], [place] [address]  . Sodium 09/30/2019 133* 135 - 145 mmol/L Final  . Potassium 09/30/2019 4.1  3.5 - 5.1 mmol/L Final  . Chloride 09/30/2019 100  98 - 111 mmol/L Final  . CO2 09/30/2019 24  22 - 32 mmol/L Final  . Glucose, Bld 09/30/2019 110* 70 - 99 mg/dL Final   Glucose reference range applies only to samples taken after fasting for at least 8 hours.  . BUN 09/30/2019 18  6 - 20 mg/dL Final  . Creatinine, Ser 09/30/2019 0.85  0.61 - 1.24 mg/dL Final  . Calcium 07/37/1062 9.3  8.9 - 10.3 mg/dL Final  . Total Protein 09/30/2019 7.8  6.5 - 8.1 g/dL Final  . Albumin 69/48/5462 4.4  3.5 - 5.0 g/dL Final  . AST 70/35/0093 21  15 - 41 U/L Final  . ALT 09/30/2019 23  0 - 44 U/L Final  . Alkaline Phosphatase 09/30/2019 80  38 - 126 U/L Final  . Total Bilirubin 09/30/2019 0.9  0.3 - 1.2 mg/dL Final  . GFR calc non Af Amer 09/30/2019 >60  >60 mL/min Final  . GFR calc Af Amer 09/30/2019 >60  >60 mL/min Final  . Anion gap 09/30/2019 9  5 - 15 Final   Performed at [facility], [address] [place], [place] [address]     Assessment:  Jerry Bautista is a 44 y.o. male with multiple sclerosisdiagnosed in 2000.He presented with diplopia. He previously received steroids, and Avonex/Rebif (interferon beta-1a). He has been on Tysabri(last 03/15/2019)  Head MRIon 12/26/2017 revealed nochange moderate cerebral and cerebellar signal abnormalities consistent with the patient's history of multiple sclerosis. Many lesions demonstrate decreased T1 signal.There was nochange moderate atrophy. There was noabnormal enhancement to confirm active demyelination. There was noMR evidence of PML.  Cervical and thoracic spine MRIon 12/26/2017 revealed mild/moderate patchy T2 signal abnormalitiesin the cervical andthoracic spinal cord, c/wthe patient's history of multiple sclerosis. There was no abnormal enhancement to confirm active demyelination.  JC antibodywas negative (index 0.10) on 11/23/2017 and 0.23 (indeterminate) on 01/31/2019.  JCV antibody by inhibition was negative on 02/09/2019.  Symptomatically, he is doing well.  He voices no concerns.  Exam is stable.  Plan: 1.   Labs today: CBC with diff, CMP. 2.Multiple sclerosis Clinically, he continues to do well.  Letter for approval of Tsabri administration from 04/10/2019 was reviewed.   JCV testing reviewed with patient:    If negative or < 0.4- checked yearly.    If 0.4 or higher- checked every 6 months.    Testing on 02/09/2019 was negative (due 01/2020).   CBC with diff and CMP every 3 months. 3.   Tysabri today. 4.   RTC every 4 weeks x 6 for Tysabri. 5.   Every 12 weeks add labs (CBC with diff, CMP). 6.   RTC in 24 weeks for MD assessment, labs (CBC with diff, CMP), and Tysabri.  I discussed the assessment and treatment plan with the patient.  The patient was provided an opportunity to ask questions and all were answered.  The patient agreed with the plan and demonstrated an understanding of the instructions.  The  patient was advised to call back if the symptoms worsen or if the condition fails to improve as anticipated.   Rosey Bath, MD, PhD    09/30/2019, 10:09 AM  I, Theador Hawthorne, am acting as scribe for [employer]. Merlene Pulling, MD, PhD.  I, Soraida Vickers C. Merlene Pulling, MD, have reviewed the  above documentation for accuracy and completeness, and I agree with the above.

## 2019-09-30 ENCOUNTER — Inpatient Hospital Stay: Payer: Medicare Other

## 2019-09-30 ENCOUNTER — Encounter: Payer: Self-pay | Admitting: Hematology and Oncology

## 2019-09-30 ENCOUNTER — Other Ambulatory Visit: Payer: Self-pay

## 2019-09-30 ENCOUNTER — Inpatient Hospital Stay (HOSPITAL_BASED_OUTPATIENT_CLINIC_OR_DEPARTMENT_OTHER): Payer: Medicare Other | Admitting: Hematology and Oncology

## 2019-09-30 ENCOUNTER — Inpatient Hospital Stay: Payer: Medicare Other | Attending: Hematology and Oncology

## 2019-09-30 VITALS — BP 107/73 | HR 73 | Temp 98.1°F | Resp 16

## 2019-09-30 VITALS — BP 108/83 | HR 99 | Temp 97.0°F | Resp 18 | Wt 211.5 lb

## 2019-09-30 DIAGNOSIS — G35 Multiple sclerosis: Secondary | ICD-10-CM

## 2019-09-30 LAB — COMPREHENSIVE METABOLIC PANEL
ALT: 23 U/L (ref 0–44)
AST: 21 U/L (ref 15–41)
Albumin: 4.4 g/dL (ref 3.5–5.0)
Alkaline Phosphatase: 80 U/L (ref 38–126)
Anion gap: 9 (ref 5–15)
BUN: 18 mg/dL (ref 6–20)
CO2: 24 mmol/L (ref 22–32)
Calcium: 9.3 mg/dL (ref 8.9–10.3)
Chloride: 100 mmol/L (ref 98–111)
Creatinine, Ser: 0.85 mg/dL (ref 0.61–1.24)
GFR calc Af Amer: >60 mL/min (ref >60)
GFR calc non Af Amer: >60 mL/min (ref >60)
Glucose, Bld: 110 mg/dL — ABNORMAL HIGH (ref 70–99)
Potassium: 4.1 mmol/L (ref 3.5–5.1)
Sodium: 133 mmol/L — ABNORMAL LOW (ref 135–145)
Total Bilirubin: 0.9 mg/dL (ref 0.3–1.2)
Total Protein: 7.8 g/dL (ref 6.5–8.1)

## 2019-09-30 LAB — CBC WITH DIFFERENTIAL/PLATELET
Abs Immature Granulocytes: 0.01 10*3/uL (ref 0.00–0.07)
Basophils Absolute: 0.1 10*3/uL (ref 0.0–0.1)
Basophils Relative: 1 %
Eosinophils Absolute: 0.1 10*3/uL (ref 0.0–0.5)
Eosinophils Relative: 2 %
HCT: 40.2 % (ref 39.0–52.0)
Hemoglobin: 14 g/dL (ref 13.0–17.0)
Immature Granulocytes: 0 %
Lymphocytes Relative: 41 %
Lymphs Abs: 2.7 10*3/uL (ref 0.7–4.0)
MCH: 32.3 pg (ref 26.0–34.0)
MCHC: 34.8 g/dL (ref 30.0–36.0)
MCV: 92.8 fL (ref 80.0–100.0)
Monocytes Absolute: 0.9 10*3/uL (ref 0.1–1.0)
Monocytes Relative: 13 %
Neutro Abs: 2.9 10*3/uL (ref 1.7–7.7)
Neutrophils Relative %: 43 %
Platelets: 270 10*3/uL (ref 150–400)
RBC: 4.33 MIL/uL (ref 4.22–5.81)
RDW: 12.7 % (ref 11.5–15.5)
WBC: 6.6 10*3/uL (ref 4.0–10.5)
nRBC: 0.9 % — ABNORMAL HIGH (ref 0.0–0.2)

## 2019-09-30 MED ORDER — SODIUM CHLORIDE 0.9 % IV SOLN
300.0000 mg | Freq: Once | INTRAVENOUS | Status: AC
  Administered 2019-09-30: 300 mg via INTRAVENOUS
  Filled 2019-09-30: qty 15

## 2019-09-30 MED ORDER — ACETAMINOPHEN 500 MG PO TABS
1000.0000 mg | ORAL_TABLET | Freq: Once | ORAL | Status: AC
  Administered 2019-09-30: 1000 mg via ORAL
  Filled 2019-09-30: qty 2

## 2019-09-30 MED ORDER — SODIUM CHLORIDE 0.9 % IV SOLN
Freq: Once | INTRAVENOUS | Status: AC
  Filled 2019-09-30: qty 250

## 2019-09-30 NOTE — Patient Instructions (Signed)
Natalizumab injection What is this medicine? NATALIZUMAB (na ta LIZ you mab) is used to treat relapsing multiple sclerosis. This drug is not a cure. It is also used to treat Crohn's disease. This medicine may be used for other purposes; ask your health care provider or pharmacist if you have questions. COMMON BRAND NAME(S): Tysabri What should I tell my health care provider before I take this medicine? They need to know if you have any of these conditions:  immune system problems  progressive multifocal leukoencephalopathy (PML)  an unusual or allergic reaction to natalizumab, other medicines, foods, dyes, or preservatives  pregnant or trying to get pregnant  breast-feeding How should I use this medicine? This medicine is for infusion into a vein. It is given by a health care professional in a hospital or clinic setting. A special MedGuide will be given to you by the pharmacist with each prescription and refill. Be sure to read this information carefully each time. Talk to your pediatrician regarding the use of this medicine in children. This medicine is not approved for use in children. Overdosage: If you think you have taken too much of this medicine contact a poison control center or emergency room at once. NOTE: This medicine is only for you. Do not share this medicine with others. What if I miss a dose? It is important not to miss your dose. Call your doctor or health care professional if you are unable to keep an appointment. What may interact with this medicine? Do not take this medicine with any of the following medications:  biologic medicines such as adalimumab, certolizumab, etanercept, golimumab, infliximab This medicine may also interact with the following medications:  azathioprine  cyclosporine  interferons  6-mercaptopurine  methotrexate  other medicines that lower your chance of fighting an infection  steroid medicines like prednisone or  cortisone  vaccines This list may not describe all possible interactions. Give your health care provider a list of all the medicines, herbs, non-prescription drugs, or dietary supplements you use. Also tell them if you smoke, drink alcohol, or use illegal drugs. Some items may interact with your medicine. What should I watch for while using this medicine? Your condition will be monitored carefully while you are receiving this medicine. Visit your doctor for regular check ups. Tell your doctor or healthcare professional if your symptoms do not start to get better or if they get worse. Stay away from people who are sick. Call your doctor or health care professional for advice if you get a fever, chills or sore throat, or other symptoms of a cold or flu. Do not treat yourself. In some patients, this medicine may cause a serious brain infection that may cause death. If you have any problems seeing, thinking, speaking, walking, or standing, tell your doctor right away. If you cannot reach your doctor, get urgent medical care. What side effects may I notice from receiving this medicine? Side effects that you should report to your doctor or health care professional as soon as possible:  allergic reactions like skin rash, itching or hives, swelling of the face, lips, or tongue  breathing problems  changes in vision  chest pain  confusion  depressed mood  dizziness  feeling faint; lightheaded; falls  general ill feeling or flu-like symptoms  loss of memory  missed menstrual periods  muscle weakness  problems with balance, talking, or walking  signs and symptoms of liver injury like dark yellow or brown urine; general ill feeling or flu-like symptoms; light-colored   stools; loss of appetite; nausea; right upper belly pain; unusually weak or tired; yellowing of the eyes or skin  suicidal thoughts, mood changes  unusual bruising or bleeding  unusually weak or tired Side effects that  usually do not require medical attention (report to your doctor or health care professional if they continue or are bothersome):  headache  joint pain  muscle cramps  muscle pain  nausea, vomiting  pain, redness, or irritation at site where injected  tiredness This list may not describe all possible side effects. Call your doctor for medical advice about side effects. You may report side effects to FDA at 1-800-FDA-1088. Where should I keep my medicine? This drug is given in a hospital or clinic and will not be stored at home. NOTE: This sheet is a summary. It may not cover all possible information. If you have questions about this medicine, talk to your doctor, pharmacist, or health care provider.  2020 Elsevier/Gold Standard (2019-01-21 13:20:26)  

## 2019-09-30 NOTE — Progress Notes (Signed)
Pt wants to know if it is ok for him to have the Covid vaccine. Pt in for follow up, denies any concerns today.

## 2019-10-17 ENCOUNTER — Ambulatory Visit: Payer: Medicare Other | Attending: Internal Medicine

## 2019-10-17 DIAGNOSIS — Z23 Encounter for immunization: Secondary | ICD-10-CM

## 2019-10-17 NOTE — Progress Notes (Signed)
   Covid-19 Vaccination Clinic  Name:  Jerry Bautista    MRN: [id number] DOB: 08/06/1975  10/17/2019  [name] was observed post Covid-19 immunization for 15 minutes without incident. He was provided with Vaccine Information Sheet and instruction to access the V-Safe system.   [name] was instructed to call 911 with any severe reactions post vaccine: Marland Kitchen Difficulty breathing  . Swelling of face and throat  . A fast heartbeat  . A bad rash all over body  . Dizziness and weakness   Immunizations Administered    Name Date Dose VIS Date Route   Pfizer COVID-19 Vaccine 10/17/2019 11:49 AM 0.3 mL 07/12/2019 Intramuscular   Manufacturer: [employer], [employer]   Lot: [id number]   NDC: [address]-4

## 2019-10-28 ENCOUNTER — Inpatient Hospital Stay: Payer: Medicare Other

## 2019-10-28 VITALS — BP 107/73 | HR 67 | Temp 97.3°F | Resp 16

## 2019-10-28 DIAGNOSIS — G35 Multiple sclerosis: Secondary | ICD-10-CM

## 2019-10-28 MED ORDER — SODIUM CHLORIDE 0.9 % IV SOLN
Freq: Once | INTRAVENOUS | Status: AC
  Filled 2019-10-28: qty 250

## 2019-10-28 MED ORDER — SODIUM CHLORIDE 0.9 % IV SOLN
300.0000 mg | Freq: Once | INTRAVENOUS | Status: AC
  Administered 2019-10-28: 300 mg via INTRAVENOUS
  Filled 2019-10-28: qty 15

## 2019-10-28 MED ORDER — ACETAMINOPHEN 500 MG PO TABS
1000.0000 mg | ORAL_TABLET | Freq: Once | ORAL | Status: AC
  Administered 2019-10-28: 09:00:00 1000 mg via ORAL
  Filled 2019-10-28: qty 2

## 2019-10-28 NOTE — Patient Instructions (Signed)
Natalizumab injection What is this medicine? NATALIZUMAB (na ta LIZ you mab) is used to treat relapsing multiple sclerosis. This drug is not a cure. It is also used to treat Crohn's disease. This medicine may be used for other purposes; ask your health care provider or pharmacist if you have questions. COMMON BRAND NAME(S): Tysabri What should I tell my health care provider before I take this medicine? They need to know if you have any of these conditions:  immune system problems  progressive multifocal leukoencephalopathy (PML)  an unusual or allergic reaction to natalizumab, other medicines, foods, dyes, or preservatives  pregnant or trying to get pregnant  breast-feeding How should I use this medicine? This medicine is for infusion into a vein. It is given by a health care professional in a hospital or clinic setting. A special MedGuide will be given to you by the pharmacist with each prescription and refill. Be sure to read this information carefully each time. Talk to your pediatrician regarding the use of this medicine in children. This medicine is not approved for use in children. Overdosage: If you think you have taken too much of this medicine contact a poison control center or emergency room at once. NOTE: This medicine is only for you. Do not share this medicine with others. What if I miss a dose? It is important not to miss your dose. Call your doctor or health care professional if you are unable to keep an appointment. What may interact with this medicine? Do not take this medicine with any of the following medications:  biologic medicines such as adalimumab, certolizumab, etanercept, golimumab, infliximab This medicine may also interact with the following medications:  azathioprine  cyclosporine  interferons  6-mercaptopurine  methotrexate  other medicines that lower your chance of fighting an infection  steroid medicines like prednisone or  cortisone  vaccines This list may not describe all possible interactions. Give your health care provider a list of all the medicines, herbs, non-prescription drugs, or dietary supplements you use. Also tell them if you smoke, drink alcohol, or use illegal drugs. Some items may interact with your medicine. What should I watch for while using this medicine? Your condition will be monitored carefully while you are receiving this medicine. Visit your doctor for regular check ups. Tell your doctor or healthcare professional if your symptoms do not start to get better or if they get worse. Stay away from people who are sick. Call your doctor or health care professional for advice if you get a fever, chills or sore throat, or other symptoms of a cold or flu. Do not treat yourself. In some patients, this medicine may cause a serious brain infection that may cause death. If you have any problems seeing, thinking, speaking, walking, or standing, tell your doctor right away. If you cannot reach your doctor, get urgent medical care. What side effects may I notice from receiving this medicine? Side effects that you should report to your doctor or health care professional as soon as possible:  allergic reactions like skin rash, itching or hives, swelling of the face, lips, or tongue  breathing problems  changes in vision  chest pain  confusion  depressed mood  dizziness  feeling faint; lightheaded; falls  general ill feeling or flu-like symptoms  loss of memory  missed menstrual periods  muscle weakness  problems with balance, talking, or walking  signs and symptoms of liver injury like dark yellow or brown urine; general ill feeling or flu-like symptoms; light-colored   stools; loss of appetite; nausea; right upper belly pain; unusually weak or tired; yellowing of the eyes or skin  suicidal thoughts, mood changes  unusual bruising or bleeding  unusually weak or tired Side effects that  usually do not require medical attention (report to your doctor or health care professional if they continue or are bothersome):  headache  joint pain  muscle cramps  muscle pain  nausea, vomiting  pain, redness, or irritation at site where injected  tiredness This list may not describe all possible side effects. Call your doctor for medical advice about side effects. You may report side effects to FDA at 1-800-FDA-1088. Where should I keep my medicine? This drug is given in a hospital or clinic and will not be stored at home. NOTE: This sheet is a summary. It may not cover all possible information. If you have questions about this medicine, talk to your doctor, pharmacist, or health care provider.  2020 Elsevier/Gold Standard (2019-01-21 13:20:26)  

## 2019-11-12 ENCOUNTER — Ambulatory Visit: Payer: Medicare Other | Attending: Internal Medicine

## 2019-11-12 DIAGNOSIS — Z23 Encounter for immunization: Secondary | ICD-10-CM

## 2019-11-12 NOTE — Progress Notes (Signed)
   Covid-19 Vaccination Clinic  Name:  Jerry Bautista    MRN: [id number] DOB: [birth year]  11/12/2019  Jerry Bautista was observed post Covid-19 immunization for 15 minutes without incident. He was provided with Vaccine Information Sheet and instruction to access the V-Safe system.   Jerry Bautista was instructed to call 911 with any severe reactions post vaccine: Marland Kitchen Difficulty breathing  . Swelling of face and throat  . A fast heartbeat  . A bad rash all over body  . Dizziness and weakness   Immunizations Administered    Name Date Dose VIS Date Route   Pfizer COVID-19 Vaccine 11/12/2019  9:13 AM 0.3 mL 07/12/2019 Intramuscular   Manufacturer: [employer], [employer]   Lot: [id number]   NDC: [address]-4

## 2019-11-25 ENCOUNTER — Inpatient Hospital Stay: Payer: Medicare Other

## 2019-11-29 ENCOUNTER — Inpatient Hospital Stay: Payer: Medicare Other | Attending: Hematology and Oncology

## 2019-11-29 ENCOUNTER — Other Ambulatory Visit: Payer: Self-pay

## 2019-11-29 VITALS — BP 109/71 | HR 90 | Temp 97.4°F | Resp 16

## 2019-11-29 DIAGNOSIS — G35 Multiple sclerosis: Secondary | ICD-10-CM | POA: Diagnosis not present

## 2019-11-29 MED ORDER — ACETAMINOPHEN 500 MG PO TABS
1000.0000 mg | ORAL_TABLET | Freq: Once | ORAL | Status: AC
  Administered 2019-11-29: 1000 mg via ORAL

## 2019-11-29 MED ORDER — ACETAMINOPHEN 500 MG PO TABS
ORAL_TABLET | ORAL | Status: AC
  Filled 2019-11-29: qty 2

## 2019-11-29 MED ORDER — SODIUM CHLORIDE 0.9 % IV SOLN
300.0000 mg | Freq: Once | INTRAVENOUS | Status: AC
  Administered 2019-11-29: 10:00:00 300 mg via INTRAVENOUS
  Filled 2019-11-29: qty 15

## 2019-11-29 MED ORDER — SODIUM CHLORIDE 0.9 % IV SOLN
Freq: Once | INTRAVENOUS | Status: AC
  Filled 2019-11-29: qty 250

## 2019-11-29 NOTE — Patient Instructions (Signed)
Natalizumab injection What is this medicine? NATALIZUMAB (na ta LIZ you mab) is used to treat relapsing multiple sclerosis. This drug is not a cure. It is also used to treat Crohn's disease. This medicine may be used for other purposes; ask your health care provider or pharmacist if you have questions. COMMON BRAND NAME(S): Tysabri What should I tell my health care provider before I take this medicine? They need to know if you have any of these conditions:  immune system problems  progressive multifocal leukoencephalopathy (PML)  an unusual or allergic reaction to natalizumab, other medicines, foods, dyes, or preservatives  pregnant or trying to get pregnant  breast-feeding How should I use this medicine? This medicine is for infusion into a vein. It is given by a health care professional in a hospital or clinic setting. A special MedGuide will be given to you by the pharmacist with each prescription and refill. Be sure to read this information carefully each time. Talk to your pediatrician regarding the use of this medicine in children. This medicine is not approved for use in children. Overdosage: If you think you have taken too much of this medicine contact a poison control center or emergency room at once. NOTE: This medicine is only for you. Do not share this medicine with others. What if I miss a dose? It is important not to miss your dose. Call your doctor or health care professional if you are unable to keep an appointment. What may interact with this medicine? Do not take this medicine with any of the following medications:  biologic medicines such as adalimumab, certolizumab, etanercept, golimumab, infliximab This medicine may also interact with the following medications:  azathioprine  cyclosporine  interferons  6-mercaptopurine  methotrexate  other medicines that lower your chance of fighting an infection  steroid medicines like prednisone or  cortisone  vaccines This list may not describe all possible interactions. Give your health care provider a list of all the medicines, herbs, non-prescription drugs, or dietary supplements you use. Also tell them if you smoke, drink alcohol, or use illegal drugs. Some items may interact with your medicine. What should I watch for while using this medicine? Your condition will be monitored carefully while you are receiving this medicine. Visit your doctor for regular check ups. Tell your doctor or healthcare professional if your symptoms do not start to get better or if they get worse. Stay away from people who are sick. Call your doctor or health care professional for advice if you get a fever, chills or sore throat, or other symptoms of a cold or flu. Do not treat yourself. In some patients, this medicine may cause a serious brain infection that may cause death. If you have any problems seeing, thinking, speaking, walking, or standing, tell your doctor right away. If you cannot reach your doctor, get urgent medical care. What side effects may I notice from receiving this medicine? Side effects that you should report to your doctor or health care professional as soon as possible:  allergic reactions like skin rash, itching or hives, swelling of the face, lips, or tongue  breathing problems  changes in vision  chest pain  confusion  depressed mood  dizziness  feeling faint; lightheaded; falls  general ill feeling or flu-like symptoms  loss of memory  missed menstrual periods  muscle weakness  problems with balance, talking, or walking  signs and symptoms of liver injury like dark yellow or brown urine; general ill feeling or flu-like symptoms; light-colored   stools; loss of appetite; nausea; right upper belly pain; unusually weak or tired; yellowing of the eyes or skin  suicidal thoughts, mood changes  unusual bruising or bleeding  unusually weak or tired Side effects that  usually do not require medical attention (report to your doctor or health care professional if they continue or are bothersome):  headache  joint pain  muscle cramps  muscle pain  nausea, vomiting  pain, redness, or irritation at site where injected  tiredness This list may not describe all possible side effects. Call your doctor for medical advice about side effects. You may report side effects to FDA at 1-800-FDA-1088. Where should I keep my medicine? This drug is given in a hospital or clinic and will not be stored at home. NOTE: This sheet is a summary. It may not cover all possible information. If you have questions about this medicine, talk to your doctor, pharmacist, or health care provider.  2020 Elsevier/Gold Standard (2019-01-21 13:20:26)  

## 2019-12-23 ENCOUNTER — Inpatient Hospital Stay: Payer: Medicare Other

## 2019-12-23 ENCOUNTER — Inpatient Hospital Stay: Payer: Medicare Other | Attending: Hematology and Oncology

## 2019-12-23 ENCOUNTER — Other Ambulatory Visit: Payer: Self-pay

## 2019-12-23 VITALS — BP 108/71 | HR 74 | Temp 97.8°F | Resp 18

## 2019-12-23 DIAGNOSIS — G35 Multiple sclerosis: Secondary | ICD-10-CM | POA: Diagnosis present

## 2019-12-23 DIAGNOSIS — Z5112 Encounter for antineoplastic immunotherapy: Secondary | ICD-10-CM | POA: Diagnosis not present

## 2019-12-23 LAB — COMPREHENSIVE METABOLIC PANEL
ALT: 28 U/L (ref 0–44)
AST: 22 U/L (ref 15–41)
Albumin: 4.3 g/dL (ref 3.5–5.0)
Alkaline Phosphatase: 88 U/L (ref 38–126)
Anion gap: 7 (ref 5–15)
BUN: 21 mg/dL — ABNORMAL HIGH (ref 6–20)
CO2: 25 mmol/L (ref 22–32)
Calcium: 9 mg/dL (ref 8.9–10.3)
Chloride: 102 mmol/L (ref 98–111)
Creatinine, Ser: 0.88 mg/dL (ref 0.61–1.24)
GFR calc Af Amer: >60 mL/min (ref >60)
GFR calc non Af Amer: >60 mL/min (ref >60)
Glucose, Bld: 104 mg/dL — ABNORMAL HIGH (ref 70–99)
Potassium: 4.2 mmol/L (ref 3.5–5.1)
Sodium: 134 mmol/L — ABNORMAL LOW (ref 135–145)
Total Bilirubin: 0.6 mg/dL (ref 0.3–1.2)
Total Protein: 7.6 g/dL (ref 6.5–8.1)

## 2019-12-23 LAB — CBC WITH DIFFERENTIAL/PLATELET
Abs Immature Granulocytes: 0.03 10*3/uL (ref 0.00–0.07)
Basophils Absolute: 0.1 10*3/uL (ref 0.0–0.1)
Basophils Relative: 1 %
Eosinophils Absolute: 0.1 10*3/uL (ref 0.0–0.5)
Eosinophils Relative: 1 %
HCT: 39.9 % (ref 39.0–52.0)
Hemoglobin: 13.9 g/dL (ref 13.0–17.0)
Immature Granulocytes: 0 %
Lymphocytes Relative: 39 %
Lymphs Abs: 3.5 10*3/uL (ref 0.7–4.0)
MCH: 32.7 pg (ref 26.0–34.0)
MCHC: 34.8 g/dL (ref 30.0–36.0)
MCV: 93.9 fL (ref 80.0–100.0)
Monocytes Absolute: 1.2 10*3/uL — ABNORMAL HIGH (ref 0.1–1.0)
Monocytes Relative: 13 %
Neutro Abs: 4.1 10*3/uL (ref 1.7–7.7)
Neutrophils Relative %: 46 %
Platelets: 268 10*3/uL (ref 150–400)
RBC: 4.25 MIL/uL (ref 4.22–5.81)
RDW: 12.9 % (ref 11.5–15.5)
WBC: 9.1 10*3/uL (ref 4.0–10.5)
nRBC: 0.7 % — ABNORMAL HIGH (ref 0.0–0.2)

## 2019-12-23 MED ORDER — SODIUM CHLORIDE 0.9 % IV SOLN
Freq: Once | INTRAVENOUS | Status: AC
  Filled 2019-12-23: qty 250

## 2019-12-23 MED ORDER — ACETAMINOPHEN 500 MG PO TABS
1000.0000 mg | ORAL_TABLET | Freq: Once | ORAL | Status: AC
  Administered 2019-12-23: 1000 mg via ORAL
  Filled 2019-12-23: qty 2

## 2019-12-23 MED ORDER — SODIUM CHLORIDE 0.9 % IV SOLN
300.0000 mg | Freq: Once | INTRAVENOUS | Status: AC
  Administered 2019-12-23: 300 mg via INTRAVENOUS
  Filled 2019-12-23: qty 15

## 2020-01-20 ENCOUNTER — Inpatient Hospital Stay: Payer: Medicare Other | Attending: Hematology and Oncology

## 2020-01-20 ENCOUNTER — Other Ambulatory Visit: Payer: Self-pay

## 2020-01-20 VITALS — BP 100/64 | HR 78 | Temp 98.0°F | Resp 18

## 2020-01-20 DIAGNOSIS — Z5112 Encounter for antineoplastic immunotherapy: Secondary | ICD-10-CM | POA: Insufficient documentation

## 2020-01-20 DIAGNOSIS — G35 Multiple sclerosis: Secondary | ICD-10-CM | POA: Diagnosis present

## 2020-01-20 MED ORDER — SODIUM CHLORIDE 0.9 % IV SOLN
300.0000 mg | Freq: Once | INTRAVENOUS | Status: AC
  Administered 2020-01-20: 300 mg via INTRAVENOUS
  Filled 2020-01-20: qty 15

## 2020-01-20 MED ORDER — ACETAMINOPHEN 500 MG PO TABS
1000.0000 mg | ORAL_TABLET | Freq: Once | ORAL | Status: AC
  Administered 2020-01-20: 1000 mg via ORAL
  Filled 2020-01-20: qty 2

## 2020-01-20 MED ORDER — SODIUM CHLORIDE 0.9 % IV SOLN
Freq: Once | INTRAVENOUS | Status: AC
  Filled 2020-01-20: qty 250

## 2020-01-22 ENCOUNTER — Telehealth: Payer: Self-pay | Admitting: *Deleted

## 2020-01-22 NOTE — Telephone Encounter (Signed)
Courtney CMA faxed our clearance form to patients neurologist as patients JCV lab is due in July. Prescription is good through Jan 2022 for his Tysabri. Form specifies it is the Neurology office responsibility to obtain any necessary labwork and to fax those results to [place].

## 2020-02-06 ENCOUNTER — Telehealth: Payer: Self-pay | Admitting: *Deleted

## 2020-02-06 NOTE — Telephone Encounter (Signed)
RN placed a call to [facility], Dr Verita Lamb office. Making sure they received our clearance form which needs to be completed and returned to our office via fax on 01/21/20. The person I spoke to wasn't sure. She will reach out to a nurse there in the morning.  RN also called patient to let him know he needs to have his labs drawn by his neurology office. The JCV is due in July. I explained that our office is not drawing any of the specialty labs . He said he would reach out to that office tomorrow to get a lab appt.

## 2020-02-17 ENCOUNTER — Other Ambulatory Visit: Payer: Self-pay

## 2020-02-17 ENCOUNTER — Inpatient Hospital Stay: Payer: Medicare Other | Attending: Hematology and Oncology

## 2020-02-17 VITALS — BP 112/68 | HR 80 | Temp 98.0°F | Resp 16

## 2020-02-17 DIAGNOSIS — G35 Multiple sclerosis: Secondary | ICD-10-CM | POA: Insufficient documentation

## 2020-02-17 MED ORDER — SODIUM CHLORIDE 0.9 % IV SOLN
Freq: Once | INTRAVENOUS | Status: AC
  Filled 2020-02-17: qty 250

## 2020-02-17 MED ORDER — SODIUM CHLORIDE 0.9 % IV SOLN
300.0000 mg | Freq: Once | INTRAVENOUS | Status: AC
  Administered 2020-02-17: 300 mg via INTRAVENOUS
  Filled 2020-02-17: qty 15

## 2020-02-17 MED ORDER — ACETAMINOPHEN 500 MG PO TABS
1000.0000 mg | ORAL_TABLET | Freq: Once | ORAL | Status: AC
  Administered 2020-02-17: 1000 mg via ORAL
  Filled 2020-02-17: qty 2

## 2020-03-16 ENCOUNTER — Encounter: Payer: Self-pay | Admitting: Nurse Practitioner

## 2020-03-16 ENCOUNTER — Inpatient Hospital Stay: Payer: Medicare Other | Attending: Nurse Practitioner

## 2020-03-16 ENCOUNTER — Inpatient Hospital Stay: Payer: Medicare Other

## 2020-03-16 ENCOUNTER — Other Ambulatory Visit: Payer: Self-pay | Admitting: Hematology and Oncology

## 2020-03-16 ENCOUNTER — Inpatient Hospital Stay (HOSPITAL_BASED_OUTPATIENT_CLINIC_OR_DEPARTMENT_OTHER): Payer: Medicare Other | Admitting: Nurse Practitioner

## 2020-03-16 ENCOUNTER — Other Ambulatory Visit: Payer: Self-pay

## 2020-03-16 VITALS — BP 114/83 | HR 91 | Temp 97.0°F | Resp 18 | Ht 74.0 in | Wt 205.9 lb

## 2020-03-16 VITALS — BP 107/75 | HR 68 | Temp 97.0°F | Resp 18

## 2020-03-16 DIAGNOSIS — G35 Multiple sclerosis: Secondary | ICD-10-CM | POA: Diagnosis not present

## 2020-03-16 MED ORDER — SODIUM CHLORIDE 0.9 % IV SOLN
300.0000 mg | Freq: Once | INTRAVENOUS | Status: AC
  Administered 2020-03-16: 300 mg via INTRAVENOUS
  Filled 2020-03-16: qty 15

## 2020-03-16 MED ORDER — ACETAMINOPHEN 500 MG PO TABS
1000.0000 mg | ORAL_TABLET | Freq: Once | ORAL | Status: AC
  Administered 2020-03-16: 1000 mg via ORAL
  Filled 2020-03-16: qty 2

## 2020-03-16 MED ORDER — SODIUM CHLORIDE 0.9 % IV SOLN
Freq: Once | INTRAVENOUS | Status: AC
  Filled 2020-03-16: qty 250

## 2020-03-16 NOTE — Progress Notes (Signed)
[facility], [place]  [address], [address] [place], [place] [address] Phone: [phone]  Fax: [phone]   Clinic Day:  03/16/2020  Referring physician: Selina Cooley, MD  Chief Complaint: Jerry Bautista is a 44 y.o. male with multiple sclerosis who is seen for a 6 month assessment on Tysabri.  HPI: Patient returns to clinic today for reevaluation and consideration of Tysabri for history of multiple sclerosis.  He was last seen by Dr. Merlene Pulling on 09/30/2019.  He had labs with neurology on 03/09/2020 and by his report is doing well and denies specific complaints.  He says that Donnamarie Poag has been a miracle drug for him.  He specifically denies any focal numbness, weakness, balance or coordination issues, and falls.  He is followed by physical therapy.  Overall he feels well today and denies specific complaints.  No rash, nausea, or interval infections, no UTI, no depression or fatigue.  No headache, no aches and pains.   Past Medical History:  Diagnosis Date  . Hypertension   . MS (multiple sclerosis) (HCC)     History reviewed. No pertinent surgical history.  History reviewed. No pertinent family history.  Social History:  reports that he has never smoked. He has never used smokeless tobacco. No history on file for alcohol use and drug use. He does not work. He is on disability/retired in 1998. 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 He has 2 daughters. He lives in [place]. The patient is alone today.  Allergies: No Known Allergies  Current Medications: Current Outpatient Medications  Medication Sig Dispense Refill  . baclofen (LIORESAL) 10 MG tablet Take 10 mg by mouth 2 (two) times a day.    . dalfampridine (AMPYRA) 10 MG TB12 Take 10 mg by mouth 2 (two) times daily.    . ferrous fumarate (HEMOCYTE - 106 MG FE) 325 (106 FE) MG TABS tablet Take 1 tablet by mouth.    Marland Kitchen lisinopril (ZESTRIL) 10 MG tablet Take by mouth.    .  Multiple Vitamin (MULTIVITAMIN) tablet Take 1 tablet by mouth daily.    . Omega-3 Fatty Acids (FISH OIL) 1000 MG CAPS Take 1 capsule by mouth daily.     No current facility-administered medications for this visit.    Review of Systems  Constitutional: Negative for chills, diaphoresis, fever, malaise/fatigue and weight loss.  HENT: Negative.  Negative for congestion, ear pain, hearing loss, nosebleeds, sinus pain and sore throat.   Eyes: Negative.  Negative for blurred vision and double vision.  Respiratory: Negative.  Negative for cough, sputum production, shortness of breath and wheezing.   Cardiovascular: Negative.  Negative for chest pain, palpitations, orthopnea, leg swelling and PND.       Hypertension.  Gastrointestinal: Negative.  Negative for abdominal pain, blood in stool, constipation, diarrhea, melena, nausea and vomiting.  Genitourinary: Negative.  Negative for dysuria, frequency, hematuria and urgency.  Musculoskeletal: Negative.  Negative for back pain, falls, joint pain and myalgias.  Skin: Negative.  Negative for rash.  Neurological: Negative.  Negative for dizziness, tingling, sensory change, speech change, focal weakness, seizures and weakness.       Multiple sclerosis.  Endo/Heme/Allergies: Negative.  Does not bruise/bleed easily.  Psychiatric/Behavioral: Negative.  Negative for depression and memory loss. The patient is not nervous/anxious and does not have insomnia.   All other systems reviewed and are negative.  Performance status (ECOG): 1  Vitals Blood pressure 114/83, pulse 91, temperature (!) 97 F (36.1 C), resp. rate 18,  height 6\' 2"  (1.88 m), weight 205 lb 14.6 oz (93.4 kg), SpO2 98 %.   Physical Exam Vitals and nursing note reviewed.  Constitutional:      General: He is not in acute distress.    Appearance: He is well-developed. He is not diaphoretic.  HENT:     Head: Normocephalic and atraumatic.     Mouth/Throat:     Pharynx: No oropharyngeal  exudate.  Eyes:     General: No scleral icterus.    Conjunctiva/sclera: Conjunctivae normal.     Comments: Brown eyes.  Neck:     Vascular: No JVD.  Cardiovascular:     Rate and Rhythm: Normal rate and regular rhythm.     Heart sounds: Normal heart sounds. No murmur heard.   Abdominal:     Tenderness: There is no abdominal tenderness. There is no guarding or rebound.  Musculoskeletal:        General: No deformity. Normal range of motion.     Cervical back: Normal range of motion and neck supple.  Lymphadenopathy:     Head:     Right side of head: No preauricular, posterior auricular or occipital adenopathy.     Left side of head: No preauricular, posterior auricular or occipital adenopathy.     Cervical: No cervical adenopathy.     Upper Body:     Right upper body: No supraclavicular adenopathy.     Left upper body: No supraclavicular adenopathy.     Lower Body: No right inguinal adenopathy. No left inguinal adenopathy.  Skin:    General: Skin is warm and dry.     Coloration: Skin is not pale.  Neurological:     Mental Status: He is alert and oriented to person, place, and time. Mental status is at baseline.  Psychiatric:        Behavior: Behavior normal.        Thought Content: Thought content normal.        Judgment: Judgment normal.     No visits with results within 3 Day(s) from this visit.  Latest known visit with results is:  Appointment on 12/23/2019  Component Date Value Ref Range Status  . WBC 12/23/2019 9.1  4.0 - 10.5 K/uL Final  . RBC 12/23/2019 4.25  4.22 - 5.81 MIL/uL Final  . Hemoglobin 12/23/2019 13.9  13.0 - 17.0 g/dL Final  . HCT 53/66/4403 39.9  39 - 52 % Final  . MCV 12/23/2019 93.9  80.0 - 100.0 fL Final  . MCH 12/23/2019 32.7  26.0 - 34.0 pg Final  . MCHC 12/23/2019 34.8  30.0 - 36.0 g/dL Final  . RDW 47/42/5956 12.9  11.5 - 15.5 % Final  . Platelets 12/23/2019 268  150 - 400 K/uL Final  . nRBC 12/23/2019 0.7* 0.0 - 0.2 % Final  . Neutrophils  Relative % 12/23/2019 46  % Final  . Neutro Abs 12/23/2019 4.1  1.7 - 7.7 K/uL Final  . Lymphocytes Relative 12/23/2019 39  % Final  . Lymphs Abs 12/23/2019 3.5  0.7 - 4.0 K/uL Final  . Monocytes Relative 12/23/2019 13  % Final  . Monocytes Absolute 12/23/2019 1.2* 0 - 1 K/uL Final  . Eosinophils Relative 12/23/2019 1  % Final  . Eosinophils Absolute 12/23/2019 0.1  0 - 0 K/uL Final  . Basophils Relative 12/23/2019 1  % Final  . Basophils Absolute 12/23/2019 0.1  0 - 0 K/uL Final  . Immature Granulocytes 12/23/2019 0  % Final  . Abs  Immature Granulocytes 12/23/2019 0.03  0.00 - 0.07 K/uL Final   Performed at [facility], [address] [place], [place] [address]  . Sodium 12/23/2019 134* 135 - 145 mmol/L Final  . Potassium 12/23/2019 4.2  3.5 - 5.1 mmol/L Final  . Chloride 12/23/2019 102  98 - 111 mmol/L Final  . CO2 12/23/2019 25  22 - 32 mmol/L Final  . Glucose, Bld 12/23/2019 104* 70 - 99 mg/dL Final   Glucose reference range applies only to samples taken after fasting for at least 8 hours.  . BUN 12/23/2019 21* 6 - 20 mg/dL Final  . Creatinine, Ser 12/23/2019 0.88  0.61 - 1.24 mg/dL Final  . Calcium 32/95/1884 9.0  8.9 - 10.3 mg/dL Final  . Total Protein 12/23/2019 7.6  6.5 - 8.1 g/dL Final  . Albumin 16/60/6301 4.3  3.5 - 5.0 g/dL Final  . AST 60/05/9322 22  15 - 41 U/L Final  . ALT 12/23/2019 28  0 - 44 U/L Final  . Alkaline Phosphatase 12/23/2019 88  38 - 126 U/L Final  . Total Bilirubin 12/23/2019 0.6  0.3 - 1.2 mg/dL Final  . GFR calc non Af Amer 12/23/2019 >60  >60 mL/min Final  . GFR calc Af Amer 12/23/2019 >60  >60 mL/min Final  . Anion gap 12/23/2019 7  5 - 15 Final   Performed at [facility], [address], [place], [place] [address]    Assessment:  Wynston Romey is a 44 y.o. male with multiple sclerosisdiagnosed in 2000.He presented with diplopia. He previously received steroids, and Avonex/Rebif (interferon beta-1a). He has been on  Tysabri(last 03/15/2019)  Head MRIon 12/26/2017 revealed nochange moderate cerebral and cerebellar signal abnormalities consistent with the patient's history of multiple sclerosis. Many lesions demonstrate decreased T1 signal.There was nochange moderate atrophy. There was noabnormal enhancement to confirm active demyelination. There was noMR evidence of PML.  Cervical and thoracic spine MRIon 12/26/2017 revealed mild/moderate patchy T2 signal abnormalitiesin the cervical andthoracic spinal cord, c/wthe patient's history of multiple sclerosis. There was no abnormal enhancement to confirm active demyelination.  JC antibodywas negative (index 0.10) on 11/23/2017 and 0.23 (indeterminate) on 01/31/2019.  JCV antibody by inhibition was negative on 02/09/2019. JCV was 2.37 on 03/09/20- positive.   Symptomatically, he is doing well.  He voices no concerns.  Exam is stable.  Plan: 1.   Labs from neurology were reviewed today: CBC, JCV, CMP 2.Multiple sclerosis  Clinically doing well and tolerating treatment without significant side effects.   Reviewed notes from neurology from 03/09/2020.  He continues Tysabri, baclofen, and ampyra.   JCV testing reviewed with patient:   If negative or < 0.4- checked yearly.   If 0.4 or higher- checked every 6 months.   Testing on 02/09/2019 was negative (due 01/2020).  CBC with diff and CMP every 3 months.  Tysabri today.   Disposition:  rtc every 4 weeks x 6 for Tysabri Labs every 12 weeks with Neurology. Please forward to cancer center for review.  RTC in 24 weeks for labs (cbc, cmp), MD evaluation, and tysabri.   I discussed the assessment and treatment plan with the patient.  The patient was provided an opportunity to ask questions and all were answered.  The patient agreed with the plan and demonstrated an understanding of the instructions.  The patient was advised to call back if the symptoms worsen or if the condition fails to improve as  anticipated.  Consuello Masse, DNP, AGNP-C [facility] [phone] (clinic)

## 2020-03-16 NOTE — Progress Notes (Signed)
No new changes noted today 

## 2020-03-17 ENCOUNTER — Other Ambulatory Visit: Payer: Self-pay | Admitting: Neurology

## 2020-03-17 DIAGNOSIS — G35 Multiple sclerosis: Secondary | ICD-10-CM

## 2020-04-03 ENCOUNTER — Other Ambulatory Visit: Payer: Self-pay

## 2020-04-03 ENCOUNTER — Ambulatory Visit
Admission: RE | Admit: 2020-04-03 | Discharge: 2020-04-03 | Disposition: A | Discharge status: Home / Self Care | Payer: Medicare Other | Source: Ambulatory Visit | Attending: Neurology | Admitting: Neurology

## 2020-04-03 DIAGNOSIS — G35 Multiple sclerosis: Secondary | ICD-10-CM | POA: Insufficient documentation

## 2020-04-03 MED ORDER — GADOBUTROL 1 MMOL/ML IV SOLN
9.0000 mL | Freq: Once | INTRAVENOUS | Status: AC | PRN
  Administered 2020-04-03: 9 mL via INTRAVENOUS

## 2020-04-13 ENCOUNTER — Inpatient Hospital Stay: Payer: Medicare Other | Attending: Hematology and Oncology

## 2020-04-13 ENCOUNTER — Other Ambulatory Visit: Payer: Self-pay

## 2020-04-13 VITALS — BP 117/69 | HR 82 | Temp 96.5°F | Resp 16

## 2020-04-13 DIAGNOSIS — G35 Multiple sclerosis: Secondary | ICD-10-CM | POA: Insufficient documentation

## 2020-04-13 MED ORDER — SODIUM CHLORIDE 0.9 % IV SOLN
Freq: Once | INTRAVENOUS | Status: AC
  Filled 2020-04-13: qty 250

## 2020-04-13 MED ORDER — ACETAMINOPHEN 500 MG PO TABS
1000.0000 mg | ORAL_TABLET | Freq: Once | ORAL | Status: AC
  Administered 2020-04-13: 1000 mg via ORAL
  Filled 2020-04-13: qty 2

## 2020-04-13 MED ORDER — SODIUM CHLORIDE 0.9 % IV SOLN
300.0000 mg | Freq: Once | INTRAVENOUS | Status: AC
  Administered 2020-04-13: 300 mg via INTRAVENOUS
  Filled 2020-04-13: qty 15

## 2020-05-11 ENCOUNTER — Inpatient Hospital Stay: Payer: Medicare Other | Attending: Hematology and Oncology

## 2020-05-11 ENCOUNTER — Other Ambulatory Visit: Payer: Self-pay

## 2020-05-11 VITALS — BP 108/64 | HR 77 | Temp 98.4°F | Resp 18

## 2020-05-11 DIAGNOSIS — G35 Multiple sclerosis: Secondary | ICD-10-CM | POA: Insufficient documentation

## 2020-05-11 MED ORDER — ACETAMINOPHEN 500 MG PO TABS
1000.0000 mg | ORAL_TABLET | Freq: Once | ORAL | Status: AC
  Administered 2020-05-11: 1000 mg via ORAL
  Filled 2020-05-11: qty 2

## 2020-05-11 MED ORDER — SODIUM CHLORIDE 0.9 % IV SOLN
300.0000 mg | Freq: Once | INTRAVENOUS | Status: AC
  Administered 2020-05-11: 300 mg via INTRAVENOUS
  Filled 2020-05-11: qty 15

## 2020-05-11 MED ORDER — SODIUM CHLORIDE 0.9 % IV SOLN
Freq: Once | INTRAVENOUS | Status: AC
  Filled 2020-05-11: qty 250

## 2020-06-03 ENCOUNTER — Other Ambulatory Visit: Payer: Self-pay

## 2020-06-03 DIAGNOSIS — G35 Multiple sclerosis: Secondary | ICD-10-CM

## 2020-06-08 ENCOUNTER — Other Ambulatory Visit: Payer: Self-pay

## 2020-06-08 ENCOUNTER — Inpatient Hospital Stay: Payer: Medicare Other

## 2020-06-08 ENCOUNTER — Inpatient Hospital Stay: Payer: Medicare Other | Attending: Hematology and Oncology

## 2020-06-08 VITALS — BP 115/79 | HR 69 | Temp 97.8°F | Resp 18

## 2020-06-08 DIAGNOSIS — G35 Multiple sclerosis: Secondary | ICD-10-CM

## 2020-06-08 LAB — CBC WITH DIFFERENTIAL/PLATELET
Abs Immature Granulocytes: 0.02 10*3/uL (ref 0.00–0.07)
Basophils Absolute: 0.1 10*3/uL (ref 0.0–0.1)
Basophils Relative: 1 %
Eosinophils Absolute: 0.1 10*3/uL (ref 0.0–0.5)
Eosinophils Relative: 1 %
HCT: 40.5 % (ref 39.0–52.0)
Hemoglobin: 14.2 g/dL (ref 13.0–17.0)
Immature Granulocytes: 0 %
Lymphocytes Relative: 37 %
Lymphs Abs: 3.1 10*3/uL (ref 0.7–4.0)
MCH: 33.2 pg (ref 26.0–34.0)
MCHC: 35.1 g/dL (ref 30.0–36.0)
MCV: 94.6 fL (ref 80.0–100.0)
Monocytes Absolute: 1 10*3/uL (ref 0.1–1.0)
Monocytes Relative: 12 %
Neutro Abs: 4 10*3/uL (ref 1.7–7.7)
Neutrophils Relative %: 49 %
Platelets: 284 10*3/uL (ref 150–400)
RBC: 4.28 MIL/uL (ref 4.22–5.81)
RDW: 13.2 % (ref 11.5–15.5)
WBC: 8.3 10*3/uL (ref 4.0–10.5)
nRBC: 1.3 % — ABNORMAL HIGH (ref 0.0–0.2)

## 2020-06-08 LAB — COMPREHENSIVE METABOLIC PANEL
ALT: 22 U/L (ref 0–44)
AST: 19 U/L (ref 15–41)
Albumin: 4.3 g/dL (ref 3.5–5.0)
Alkaline Phosphatase: 96 U/L (ref 38–126)
Anion gap: 6 (ref 5–15)
BUN: 17 mg/dL (ref 6–20)
CO2: 27 mmol/L (ref 22–32)
Calcium: 9.2 mg/dL (ref 8.9–10.3)
Chloride: 101 mmol/L (ref 98–111)
Creatinine, Ser: 0.92 mg/dL (ref 0.61–1.24)
GFR, Estimated: >60 mL/min (ref >60)
Glucose, Bld: 94 mg/dL (ref 70–99)
Potassium: 4.3 mmol/L (ref 3.5–5.1)
Sodium: 134 mmol/L — ABNORMAL LOW (ref 135–145)
Total Bilirubin: 0.6 mg/dL (ref 0.3–1.2)
Total Protein: 7.9 g/dL (ref 6.5–8.1)

## 2020-06-08 MED ORDER — SODIUM CHLORIDE 0.9 % IV SOLN
Freq: Once | INTRAVENOUS | Status: AC
  Filled 2020-06-08: qty 250

## 2020-06-08 MED ORDER — SODIUM CHLORIDE 0.9 % IV SOLN
300.0000 mg | Freq: Once | INTRAVENOUS | Status: AC
  Administered 2020-06-08: 300 mg via INTRAVENOUS
  Filled 2020-06-08: qty 15

## 2020-06-08 MED ORDER — ACETAMINOPHEN 500 MG PO TABS
1000.0000 mg | ORAL_TABLET | Freq: Once | ORAL | Status: AC
  Administered 2020-06-08: 1000 mg via ORAL
  Filled 2020-06-08: qty 2

## 2020-06-08 NOTE — Progress Notes (Signed)
Patient received prescribed treatment in clinic without difficulty; tolerated well. Patient discharged in stable condition. 

## 2020-07-01 ENCOUNTER — Other Ambulatory Visit: Payer: Self-pay

## 2020-07-01 ENCOUNTER — Encounter: Payer: Self-pay | Admitting: Neurology

## 2020-07-06 ENCOUNTER — Inpatient Hospital Stay: Payer: Medicare Other | Attending: Hematology and Oncology

## 2020-07-06 ENCOUNTER — Other Ambulatory Visit: Payer: Self-pay

## 2020-07-06 VITALS — BP 118/84 | HR 68 | Temp 95.9°F | Resp 18

## 2020-07-06 DIAGNOSIS — G35 Multiple sclerosis: Secondary | ICD-10-CM | POA: Diagnosis not present

## 2020-07-06 MED ORDER — SODIUM CHLORIDE 0.9 % IV SOLN
Freq: Once | INTRAVENOUS | Status: AC
  Filled 2020-07-06: qty 250

## 2020-07-06 MED ORDER — SODIUM CHLORIDE 0.9 % IV SOLN
300.0000 mg | Freq: Once | INTRAVENOUS | Status: AC
  Administered 2020-07-06: 300 mg via INTRAVENOUS
  Filled 2020-07-06: qty 15

## 2020-07-06 MED ORDER — ACETAMINOPHEN 500 MG PO TABS
1000.0000 mg | ORAL_TABLET | Freq: Once | ORAL | Status: AC
  Administered 2020-07-06: 1000 mg via ORAL
  Filled 2020-07-06: qty 2

## 2020-07-06 NOTE — Progress Notes (Signed)
Patient received prescribed treatment in clinic. Tolerated well. Patient stable at discharge. 

## 2020-08-03 ENCOUNTER — Inpatient Hospital Stay: Payer: Medicare Other

## 2020-08-05 ENCOUNTER — Other Ambulatory Visit: Payer: Self-pay

## 2020-08-05 ENCOUNTER — Inpatient Hospital Stay: Payer: Medicare Other | Attending: Hematology and Oncology

## 2020-08-05 VITALS — BP 117/87 | HR 86 | Temp 96.4°F | Resp 16 | Wt 211.4 lb

## 2020-08-05 DIAGNOSIS — G35 Multiple sclerosis: Secondary | ICD-10-CM | POA: Insufficient documentation

## 2020-08-05 MED ORDER — SODIUM CHLORIDE 0.9 % IV SOLN
Freq: Once | INTRAVENOUS | Status: AC
  Filled 2020-08-05: qty 250

## 2020-08-05 MED ORDER — ACETAMINOPHEN 500 MG PO TABS
1000.0000 mg | ORAL_TABLET | Freq: Once | ORAL | Status: AC
  Administered 2020-08-05: 1000 mg via ORAL
  Filled 2020-08-05: qty 2

## 2020-08-05 MED ORDER — SODIUM CHLORIDE 0.9 % IV SOLN
300.0000 mg | Freq: Once | INTRAVENOUS | Status: AC
  Administered 2020-08-05: 300 mg via INTRAVENOUS
  Filled 2020-08-05: qty 15

## 2020-08-05 NOTE — Progress Notes (Signed)
Patient received prescribed treatment in clinic. Tolerated well. Patient stable at discharge. 

## 2020-08-26 ENCOUNTER — Other Ambulatory Visit: Payer: Self-pay

## 2020-08-26 DIAGNOSIS — G35 Multiple sclerosis: Secondary | ICD-10-CM

## 2020-08-27 ENCOUNTER — Telehealth: Payer: Self-pay

## 2020-08-27 NOTE — Progress Notes (Signed)
[facility]  [address] [place], [place] [address] Phone: [phone]  Fax: [phone]   Clinic Day:  08/31/2020  Referring physician: Selina Cooley, MD  Chief Complaint: Jerry Bautista is a 45 y.o. male with multiple sclerosis who is seen for 6 month assessment on Tysabri.  HPI: The patient was last seen in the hematology clinic on 03/16/2020 by Consuello Masse, NP. At that time, he was doing well. He voiced no concerns.  Exam was stable. He received Tysabri.  Head MRI with and without contrast on 04/03/2020 revealed findings c/w chronic multiple sclerosis, affecting the brainstem, middle cerebellar peduncles and cerebellar hemispheric white matter. There was chronic involvement and volume loss of the corpus callosum and cerebral hemispheric white matter, particularly pronounced in the parietal regions. There were no areas of restricted diffusion or contrast enhancement to suggest active demyelination. The findings are similar to 04/05/2019.  Labs on 06/08/2020 revealed a hematocrit of 40.5, hemoglobin 14.2, platelets 284,000, WBC 8,300.   He received Tysabri monthly x 5 (04/13/2020 - 08/05/2020).  We received confirmation from Dr. Lemar Lofty that the patient was cleared for Tysabri for the next 6 months on 04/28/2020.  During the interim, he has been "good." He denies and symptoms and complaints.  He usually received treatments on Mondays. He missed treatment on 08/03/2020 because he did not get a reminder. Treatment was moved to 08/05/2020.   Past Medical History:  Diagnosis Date  . Hypertension   . MS (multiple sclerosis) (HCC)     History reviewed. No pertinent surgical history.  History reviewed. No pertinent family history.  Social History:  reports that he has never smoked. He has never used smokeless tobacco. No history on file for alcohol use and drug use. He does not work. He is on disability/retired in 1998. 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 He has 2 daughters. He lives in [place]. The patient is alone today.  Allergies: No Known Allergies  Current Medications: Current Outpatient Medications  Medication Sig Dispense Refill  . baclofen (LIORESAL) 10 MG tablet Take 10 mg by mouth 2 (two) times a day.    . dalfampridine 10 MG TB12 Take 10 mg by mouth 2 (two) times daily.    . ferrous fumarate (HEMOCYTE - 106 MG FE) 325 (106 FE) MG TABS tablet Take 1 tablet by mouth.    Marland Kitchen lisinopril (ZESTRIL) 10 MG tablet Take by mouth.    Marland Kitchen lisinopril-hydrochlorothiazide (ZESTORETIC) 10-12.5 MG tablet Take 1 tablet by mouth daily.    . Multiple Vitamin (MULTIVITAMIN) tablet Take 1 tablet by mouth daily.    . Omega-3 Fatty Acids (FISH OIL) 1000 MG CAPS Take 1 capsule by mouth daily.     No current facility-administered medications for this visit.    Review of Systems  Constitutional: Positive for weight loss (3 lbs). Negative for chills, diaphoresis, fever and malaise/fatigue.       Feels "good".  HENT: Negative.  Negative for congestion, ear discharge, ear pain, hearing loss, nosebleeds, sinus pain, sore throat and tinnitus.   Eyes: Negative.  Negative for blurred vision and double vision.  Respiratory: Negative.  Negative for cough, hemoptysis, sputum production and shortness of breath.   Cardiovascular: Negative for chest pain, palpitations, orthopnea and leg swelling.       Hypertension.  Gastrointestinal: Negative.  Negative for abdominal pain, blood in stool, constipation, diarrhea, heartburn, melena, nausea and vomiting.  Genitourinary: Negative.  Negative for dysuria, frequency, hematuria  and urgency.  Musculoskeletal: Negative.  Negative for back pain, joint pain, myalgias and neck pain.  Skin: Negative.  Negative for itching and rash.  Neurological: Negative.  Negative for dizziness, tingling, sensory change, speech change, focal weakness, seizures, weakness and headaches.        Multiple sclerosis.  Endo/Heme/Allergies: Negative.  Does not bruise/bleed easily.  Psychiatric/Behavioral: Negative.  Negative for depression and memory loss. The patient is not nervous/anxious and does not have insomnia.   All other systems reviewed and are negative.  Performance status (ECOG): 0  Vitals Blood pressure 107/79, pulse 79, temperature 97.8 F (36.6 C), temperature source Tympanic, resp. rate 18, weight 202 lb 13.2 oz (92 kg), SpO2 100 %.   Physical Exam Vitals and nursing note reviewed.  Constitutional:      General: He is not in acute distress.    Appearance: He is well-developed. He is not diaphoretic.  HENT:     Head: Normocephalic and atraumatic.     Mouth/Throat:     Mouth: Mucous membranes are moist.     Pharynx: Oropharynx is clear. No oropharyngeal exudate.  Eyes:     General: No scleral icterus.    Extraocular Movements: Extraocular movements intact.     Conjunctiva/sclera: Conjunctivae normal.     Pupils: Pupils are equal, round, and reactive to light.     Comments: Brown eyes.  Neck:     Vascular: No JVD.  Cardiovascular:     Rate and Rhythm: Normal rate and regular rhythm.     Heart sounds: Normal heart sounds. No murmur heard.   Pulmonary:     Effort: Pulmonary effort is normal. No respiratory distress.     Breath sounds: Normal breath sounds. No wheezing or rales.  Chest:  Breasts:     Right: No supraclavicular adenopathy.     Left: No supraclavicular adenopathy.    Abdominal:     General: Bowel sounds are normal. There is no distension.     Palpations: Abdomen is soft.     Tenderness: There is no abdominal tenderness. There is no guarding or rebound.  Musculoskeletal:        General: Normal range of motion.     Cervical back: Normal range of motion and neck supple.  Lymphadenopathy:     Head:     Right side of head: No preauricular, posterior auricular or occipital adenopathy.     Left side of head: No preauricular, posterior  auricular or occipital adenopathy.     Cervical: No cervical adenopathy.     Upper Body:     Right upper body: No supraclavicular adenopathy.     Left upper body: No supraclavicular adenopathy.     Lower Body: No right inguinal adenopathy. No left inguinal adenopathy.  Skin:    General: Skin is warm and dry.     Coloration: Skin is not pale.     Findings: No erythema or rash.  Neurological:     Mental Status: He is alert and oriented to person, place, and time.     Gait: Gait abnormal.     Comments: Alert & oriented, cranial nerves II-XII intact; strength is normal throughout, left hand slightly weaker than right, sensation intact  Psychiatric:        Behavior: Behavior normal.        Thought Content: Thought content normal.        Judgment: Judgment normal.    Appointment on 08/31/2020  Component Date Value Ref Range Status  .  Sodium 08/31/2020 130* 135 - 145 mmol/L Final  . Potassium 08/31/2020 4.1  3.5 - 5.1 mmol/L Final  . Chloride 08/31/2020 97* 98 - 111 mmol/L Final  . CO2 08/31/2020 25  22 - 32 mmol/L Final  . Glucose, Bld 08/31/2020 102* 70 - 99 mg/dL Final   Glucose reference range applies only to samples taken after fasting for at least 8 hours.  . BUN 08/31/2020 19  6 - 20 mg/dL Final  . Creatinine, Ser 08/31/2020 0.88  0.61 - 1.24 mg/dL Final  . Calcium 66/29/4765 8.8* 8.9 - 10.3 mg/dL Final  . Total Protein 08/31/2020 8.0  6.5 - 8.1 g/dL Final  . Albumin 46/50/3546 4.4  3.5 - 5.0 g/dL Final  . AST 56/81/2751 19  15 - 41 U/L Final  . ALT 08/31/2020 25  0 - 44 U/L Final  . Alkaline Phosphatase 08/31/2020 77  38 - 126 U/L Final  . Total Bilirubin 08/31/2020 0.8  0.3 - 1.2 mg/dL Final  . GFR, Estimated 08/31/2020 >60  >60 mL/min Final   Comment: (NOTE) Calculated using the CKD-EPI Creatinine Equation (2021)   . Anion gap 08/31/2020 8  5 - 15 Final   Performed at [facility], [address], [place], [place] [address]  . WBC 08/31/2020 8.0  4.0 - 10.5  K/uL Final  . RBC 08/31/2020 4.16* 4.22 - 5.81 MIL/uL Final  . Hemoglobin 08/31/2020 13.6  13.0 - 17.0 g/dL Final  . HCT 49/44/9675 39.2  39.0 - 52.0 % Final  . MCV 08/31/2020 94.2  80.0 - 100.0 fL Final  . MCH 08/31/2020 32.7  26.0 - 34.0 pg Final  . MCHC 08/31/2020 34.7  30.0 - 36.0 g/dL Final  . RDW 91/63/8466 12.9  11.5 - 15.5 % Final  . Platelets 08/31/2020 274  150 - 400 K/uL Final  . nRBC 08/31/2020 0.8* 0.0 - 0.2 % Final  . Neutrophils Relative % 08/31/2020 49  % Final  . Neutro Abs 08/31/2020 3.8  1.7 - 7.7 K/uL Final  . Lymphocytes Relative 08/31/2020 38  % Final  . Lymphs Abs 08/31/2020 3.0  0.7 - 4.0 K/uL Final  . Monocytes Relative 08/31/2020 11  % Final  . Monocytes Absolute 08/31/2020 0.9  0.1 - 1.0 K/uL Final  . Eosinophils Relative 08/31/2020 1  % Final  . Eosinophils Absolute 08/31/2020 0.1  0.0 - 0.5 K/uL Final  . Basophils Relative 08/31/2020 1  % Final  . Basophils Absolute 08/31/2020 0.1  0.0 - 0.1 K/uL Final  . Immature Granulocytes 08/31/2020 0  % Final  . Abs Immature Granulocytes 08/31/2020 0.03  0.00 - 0.07 K/uL Final   Performed at [facility], [address], [place], [place] [address]    Assessment:  Jerry Bautista is a 45 y.o. male with multiple sclerosisdiagnosed in 2000.He presented with diplopia. He previously received steroids, and Avonex/Rebif (interferon beta-1a). He has been on Tysabri(last 08/05/2020)  Head MRIon 12/26/2017 revealed nochange moderate cerebral and cerebellar signal abnormalities consistent with the patient's history of multiple sclerosis. Many lesions demonstrate decreased T1 signal.There was nochange moderate atrophy. There was noabnormal enhancement to confirm active demyelination. There was noMR evidence of PML.  Head MRI  on 04/03/2020 revealed findings c/w chronic multiple sclerosis, affecting the brainstem, middle cerebellar peduncles and cerebellar hemispheric white matter. There was chronic  involvement and volume loss of the corpus callosum and cerebral hemispheric white matter, particularly pronounced in the parietal regions. There were no areas of restricted  diffusion or contrast enhancement to suggest active demyelination. Tthe findings are similar to 04/05/2019.  Cervical and thoracic spine MRIon 12/26/2017 revealed mild/moderate patchy T2 signal abnormalitiesin the cervical andthoracic spinal cord, c/wthe patient's history of multiple sclerosis. There was no abnormal enhancement to confirm active demyelination.  JC antibodywas negative (index 0.10) on 11/23/2017 and 0.23 (indeterminate) on 01/31/2019.  JCV antibody by inhibition was negative on 02/09/2019.  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  Exam is stable.  Plan: 1.   Labs today: CBC with diff, CMP. 2.Multiple sclerosis Symptomatically he is doing well.  Patient is cleared for Tysabri for the next 6 months (dated on 04/28/2020).   Patient aware of prior JCV test results. 3.   Hyponatremia  Sodium is 130.  Discuss fluids with electrolytes and f/u with PCP.  Send labs to PCP re: low sodium. 4.   RN:  Confirm plan for f/u JCV testing around 09/09/2020. 5.   RTC on 09/02/2020 for Tysabri. 6.   RTC every 4 weeks x 6 for Tysabri. 7.   Every 12 weeks add labs (CBC with diff, CMP). 8.   RTC in 24 weeks for MD assessment, labs (CBC with diff, CMP), and Tysabri.  I discussed the assessment and treatment plan with the patient.  The patient was provided an opportunity to ask questions and all were answered.  The patient agreed with the plan and demonstrated an understanding of the instructions.  The patient was advised to call back if the symptoms worsen or if the condition fails to improve as anticipated.   Rosey Bath, MD, PhD    08/31/2020, 10:30 AM  I, Danella Penton Tufford, am acting as Neurosurgeon for [employer]. Merlene Pulling, MD, PhD.  I, Zymere Patlan C.  Merlene Pulling, MD, have reviewed the above documentation for accuracy and completeness, and I agree with the above.

## 2020-08-31 ENCOUNTER — Telehealth: Payer: Self-pay

## 2020-08-31 ENCOUNTER — Inpatient Hospital Stay (HOSPITAL_BASED_OUTPATIENT_CLINIC_OR_DEPARTMENT_OTHER): Payer: Medicare Other | Admitting: Hematology and Oncology

## 2020-08-31 ENCOUNTER — Other Ambulatory Visit: Payer: Self-pay

## 2020-08-31 ENCOUNTER — Encounter: Payer: Self-pay | Admitting: Hematology and Oncology

## 2020-08-31 ENCOUNTER — Inpatient Hospital Stay: Payer: Medicare Other

## 2020-08-31 VITALS — BP 107/79 | HR 79 | Temp 97.8°F | Resp 18 | Wt 202.8 lb

## 2020-08-31 DIAGNOSIS — G35 Multiple sclerosis: Secondary | ICD-10-CM

## 2020-08-31 LAB — CBC WITH DIFFERENTIAL/PLATELET
Abs Immature Granulocytes: 0.03 10*3/uL (ref 0.00–0.07)
Basophils Absolute: 0.1 10*3/uL (ref 0.0–0.1)
Basophils Relative: 1 %
Eosinophils Absolute: 0.1 10*3/uL (ref 0.0–0.5)
Eosinophils Relative: 1 %
HCT: 39.2 % (ref 39.0–52.0)
Hemoglobin: 13.6 g/dL (ref 13.0–17.0)
Immature Granulocytes: 0 %
Lymphocytes Relative: 38 %
Lymphs Abs: 3 10*3/uL (ref 0.7–4.0)
MCH: 32.7 pg (ref 26.0–34.0)
MCHC: 34.7 g/dL (ref 30.0–36.0)
MCV: 94.2 fL (ref 80.0–100.0)
Monocytes Absolute: 0.9 10*3/uL (ref 0.1–1.0)
Monocytes Relative: 11 %
Neutro Abs: 3.8 10*3/uL (ref 1.7–7.7)
Neutrophils Relative %: 49 %
Platelets: 274 10*3/uL (ref 150–400)
RBC: 4.16 MIL/uL — ABNORMAL LOW (ref 4.22–5.81)
RDW: 12.9 % (ref 11.5–15.5)
WBC: 8 10*3/uL (ref 4.0–10.5)
nRBC: 0.8 % — ABNORMAL HIGH (ref 0.0–0.2)

## 2020-08-31 LAB — COMPREHENSIVE METABOLIC PANEL
ALT: 25 U/L (ref 0–44)
AST: 19 U/L (ref 15–41)
Albumin: 4.4 g/dL (ref 3.5–5.0)
Alkaline Phosphatase: 77 U/L (ref 38–126)
Anion gap: 8 (ref 5–15)
BUN: 19 mg/dL (ref 6–20)
CO2: 25 mmol/L (ref 22–32)
Calcium: 8.8 mg/dL — ABNORMAL LOW (ref 8.9–10.3)
Chloride: 97 mmol/L — ABNORMAL LOW (ref 98–111)
Creatinine, Ser: 0.88 mg/dL (ref 0.61–1.24)
GFR, Estimated: >60 mL/min (ref >60)
Glucose, Bld: 102 mg/dL — ABNORMAL HIGH (ref 70–99)
Potassium: 4.1 mmol/L (ref 3.5–5.1)
Sodium: 130 mmol/L — ABNORMAL LOW (ref 135–145)
Total Bilirubin: 0.8 mg/dL (ref 0.3–1.2)
Total Protein: 8 g/dL (ref 6.5–8.1)

## 2020-08-31 NOTE — Progress Notes (Signed)
Patient here for oncology follow-up appointment, expresses no complaints or concerns at this time.    

## 2020-08-31 NOTE — Telephone Encounter (Signed)
  Please confirm retesting date for JAK2.  M

## 2020-09-01 ENCOUNTER — Telehealth: Payer: Self-pay

## 2020-09-01 NOTE — Telephone Encounter (Signed)
  Thanks.  Please make a nurse's note for reference.  M

## 2020-09-02 ENCOUNTER — Other Ambulatory Visit: Payer: Self-pay

## 2020-09-02 ENCOUNTER — Inpatient Hospital Stay: Payer: Medicare Other | Attending: Hematology and Oncology

## 2020-09-02 VITALS — BP 117/72 | HR 80 | Temp 97.2°F | Resp 18

## 2020-09-02 DIAGNOSIS — G35 Multiple sclerosis: Secondary | ICD-10-CM | POA: Insufficient documentation

## 2020-09-02 MED ORDER — SODIUM CHLORIDE 0.9 % IV SOLN
Freq: Once | INTRAVENOUS | Status: AC
  Filled 2020-09-02: qty 250

## 2020-09-02 MED ORDER — SODIUM CHLORIDE 0.9 % IV SOLN
300.0000 mg | Freq: Once | INTRAVENOUS | Status: AC
  Administered 2020-09-02: 300 mg via INTRAVENOUS
  Filled 2020-09-02: qty 15

## 2020-09-02 MED ORDER — ACETAMINOPHEN 500 MG PO TABS
1000.0000 mg | ORAL_TABLET | Freq: Once | ORAL | Status: AC
  Administered 2020-09-02: 1000 mg via ORAL
  Filled 2020-09-02: qty 2

## 2020-09-02 NOTE — Progress Notes (Signed)
I spoke with the Nurse in Neurology for Jerry Bautista. She stated that he has clearence until 10/26/20. It started on 04/28/21  It was for 60months.

## 2020-09-02 NOTE — Progress Notes (Signed)
Patient received prescribed treatment in clinic. Tolerated well. Patient stable at discharge. 

## 2020-09-07 ENCOUNTER — Telehealth: Payer: Self-pay

## 2020-09-07 NOTE — Telephone Encounter (Signed)
  Please obtain copy of upcoming JC virus and note that he will be ok to treat until a certain date when the next JC virus test performed.  M

## 2020-09-09 ENCOUNTER — Telehealth: Payer: Self-pay

## 2020-09-14 ENCOUNTER — Telehealth: Payer: Self-pay

## 2020-09-15 ENCOUNTER — Telehealth: Payer: Self-pay

## 2020-09-15 NOTE — Telephone Encounter (Signed)
Nurse called from patients neurology office to let us know that she has spoken with this patient and he is coming back in to have his JCV redrawn due to increase in level, she states "dont cancel his appointment yet but I have the feeling he wont be continuing Tysabri because of his JCV level." she states that she will let us know asap.

## 2020-09-23 ENCOUNTER — Other Ambulatory Visit: Payer: Self-pay | Admitting: Neurology

## 2020-09-23 DIAGNOSIS — G35 Multiple sclerosis: Secondary | ICD-10-CM

## 2020-09-30 ENCOUNTER — Inpatient Hospital Stay: Payer: Medicare Other | Attending: Hematology and Oncology

## 2020-09-30 ENCOUNTER — Other Ambulatory Visit: Payer: Self-pay

## 2020-09-30 VITALS — BP 111/74 | HR 78 | Resp 18

## 2020-09-30 DIAGNOSIS — G35 Multiple sclerosis: Secondary | ICD-10-CM | POA: Insufficient documentation

## 2020-09-30 MED ORDER — SODIUM CHLORIDE 0.9 % IV SOLN
300.0000 mg | Freq: Once | INTRAVENOUS | Status: AC
  Administered 2020-09-30: 300 mg via INTRAVENOUS
  Filled 2020-09-30: qty 15

## 2020-09-30 MED ORDER — SODIUM CHLORIDE 0.9 % IV SOLN
Freq: Once | INTRAVENOUS | Status: AC
  Filled 2020-09-30: qty 250

## 2020-09-30 MED ORDER — ACETAMINOPHEN 500 MG PO TABS
1000.0000 mg | ORAL_TABLET | Freq: Once | ORAL | Status: AC
  Administered 2020-09-30: 1000 mg via ORAL
  Filled 2020-09-30: qty 2

## 2020-09-30 NOTE — Progress Notes (Signed)
Pt received prescribed treatment in clinic, pt stable at d/c. 

## 2020-10-28 ENCOUNTER — Inpatient Hospital Stay: Payer: Medicare Other

## 2020-10-28 ENCOUNTER — Other Ambulatory Visit: Payer: Self-pay

## 2020-10-28 VITALS — BP 112/77 | HR 66 | Resp 18

## 2020-10-28 DIAGNOSIS — G35 Multiple sclerosis: Secondary | ICD-10-CM | POA: Diagnosis not present

## 2020-10-28 MED ORDER — SODIUM CHLORIDE 0.9 % IV SOLN
Freq: Once | INTRAVENOUS | Status: AC
  Filled 2020-10-28: qty 250

## 2020-10-28 MED ORDER — ACETAMINOPHEN 500 MG PO TABS
1000.0000 mg | ORAL_TABLET | Freq: Once | ORAL | Status: AC
  Administered 2020-10-28: 1000 mg via ORAL

## 2020-10-28 MED ORDER — SODIUM CHLORIDE 0.9 % IV SOLN
300.0000 mg | Freq: Once | INTRAVENOUS | Status: AC
  Administered 2020-10-28: 300 mg via INTRAVENOUS
  Filled 2020-10-28: qty 15

## 2020-10-28 MED ORDER — ACETAMINOPHEN 500 MG PO TABS
ORAL_TABLET | ORAL | Status: AC
  Filled 2020-10-28: qty 2

## 2020-11-24 ENCOUNTER — Other Ambulatory Visit: Payer: Self-pay

## 2020-11-24 DIAGNOSIS — G35 Multiple sclerosis: Secondary | ICD-10-CM

## 2020-11-25 ENCOUNTER — Other Ambulatory Visit: Payer: Self-pay

## 2020-11-25 ENCOUNTER — Inpatient Hospital Stay: Payer: Medicare Other

## 2020-11-25 ENCOUNTER — Inpatient Hospital Stay: Payer: Medicare Other | Attending: Hematology and Oncology

## 2020-11-25 VITALS — BP 107/70 | HR 65 | Temp 96.9°F | Resp 18

## 2020-11-25 DIAGNOSIS — G35 Multiple sclerosis: Secondary | ICD-10-CM | POA: Insufficient documentation

## 2020-11-25 LAB — COMPREHENSIVE METABOLIC PANEL
ALT: 26 U/L (ref 0–44)
AST: 23 U/L (ref 15–41)
Albumin: 4.2 g/dL (ref 3.5–5.0)
Alkaline Phosphatase: 84 U/L (ref 38–126)
Anion gap: 6 (ref 5–15)
BUN: 16 mg/dL (ref 6–20)
CO2: 27 mmol/L (ref 22–32)
Calcium: 9.2 mg/dL (ref 8.9–10.3)
Chloride: 100 mmol/L (ref 98–111)
Creatinine, Ser: 0.92 mg/dL (ref 0.61–1.24)
GFR, Estimated: >60 mL/min (ref >60)
Glucose, Bld: 111 mg/dL — ABNORMAL HIGH (ref 70–99)
Potassium: 4.3 mmol/L (ref 3.5–5.1)
Sodium: 133 mmol/L — ABNORMAL LOW (ref 135–145)
Total Bilirubin: 0.7 mg/dL (ref 0.3–1.2)
Total Protein: 7.6 g/dL (ref 6.5–8.1)

## 2020-11-25 LAB — CBC WITH DIFFERENTIAL/PLATELET
Abs Immature Granulocytes: 0.03 10*3/uL (ref 0.00–0.07)
Basophils Absolute: 0.1 10*3/uL (ref 0.0–0.1)
Basophils Relative: 2 %
Eosinophils Absolute: 0.2 10*3/uL (ref 0.0–0.5)
Eosinophils Relative: 2 %
HCT: 39.4 % (ref 39.0–52.0)
Hemoglobin: 13.7 g/dL (ref 13.0–17.0)
Immature Granulocytes: 0 %
Lymphocytes Relative: 39 %
Lymphs Abs: 3 10*3/uL (ref 0.7–4.0)
MCH: 32.6 pg (ref 26.0–34.0)
MCHC: 34.8 g/dL (ref 30.0–36.0)
MCV: 93.8 fL (ref 80.0–100.0)
Monocytes Absolute: 0.9 10*3/uL (ref 0.1–1.0)
Monocytes Relative: 11 %
Neutro Abs: 3.5 10*3/uL (ref 1.7–7.7)
Neutrophils Relative %: 46 %
Platelets: 255 10*3/uL (ref 150–400)
RBC: 4.2 MIL/uL — ABNORMAL LOW (ref 4.22–5.81)
RDW: 12.9 % (ref 11.5–15.5)
WBC: 7.7 10*3/uL (ref 4.0–10.5)
nRBC: 0.8 % — ABNORMAL HIGH (ref 0.0–0.2)

## 2020-11-25 MED ORDER — ACETAMINOPHEN 500 MG PO TABS
1000.0000 mg | ORAL_TABLET | Freq: Once | ORAL | Status: AC
  Administered 2020-11-25: 1000 mg via ORAL
  Filled 2020-11-25: qty 2

## 2020-11-25 MED ORDER — NATALIZUMAB 300 MG/15ML IV CONC
300.0000 mg | Freq: Once | INTRAVENOUS | Status: AC
  Administered 2020-11-25: 300 mg via INTRAVENOUS
  Filled 2020-11-25: qty 15

## 2020-11-25 MED ORDER — SODIUM CHLORIDE 0.9 % IV SOLN
Freq: Once | INTRAVENOUS | Status: AC
  Filled 2020-11-25: qty 250

## 2020-11-25 NOTE — Progress Notes (Signed)
Feeling well today. Received his Tysabri infusion per MD orders. No complaints. VSS at discharge.

## 2020-12-23 ENCOUNTER — Inpatient Hospital Stay: Payer: Medicare Other | Attending: Hematology and Oncology

## 2020-12-23 ENCOUNTER — Other Ambulatory Visit: Payer: Self-pay

## 2020-12-23 VITALS — BP 100/67 | HR 71 | Temp 95.9°F | Resp 18

## 2020-12-23 DIAGNOSIS — G35 Multiple sclerosis: Secondary | ICD-10-CM | POA: Diagnosis not present

## 2020-12-23 MED ORDER — ACETAMINOPHEN 500 MG PO TABS
1000.0000 mg | ORAL_TABLET | Freq: Once | ORAL | Status: AC
  Administered 2020-12-23: 1000 mg via ORAL
  Filled 2020-12-23: qty 2

## 2020-12-23 MED ORDER — SODIUM CHLORIDE 0.9 % IV SOLN
Freq: Once | INTRAVENOUS | Status: AC
  Filled 2020-12-23: qty 250

## 2020-12-23 MED ORDER — NATALIZUMAB 300 MG/15ML IV CONC
300.0000 mg | Freq: Once | INTRAVENOUS | Status: AC
  Administered 2020-12-23: 300 mg via INTRAVENOUS
  Filled 2020-12-23: qty 15

## 2021-01-20 ENCOUNTER — Other Ambulatory Visit: Payer: Self-pay

## 2021-01-20 ENCOUNTER — Inpatient Hospital Stay: Payer: Medicare Other | Attending: Hematology and Oncology

## 2021-01-20 VITALS — BP 119/78 | HR 70 | Temp 97.2°F | Resp 18

## 2021-01-20 DIAGNOSIS — G35 Multiple sclerosis: Secondary | ICD-10-CM | POA: Diagnosis present

## 2021-01-20 MED ORDER — SODIUM CHLORIDE 0.9 % IV SOLN
300.0000 mg | Freq: Once | INTRAVENOUS | Status: AC
  Administered 2021-01-20: 300 mg via INTRAVENOUS
  Filled 2021-01-20: qty 15

## 2021-01-20 MED ORDER — ACETAMINOPHEN 500 MG PO TABS
1000.0000 mg | ORAL_TABLET | Freq: Once | ORAL | Status: AC
  Administered 2021-01-20: 1000 mg via ORAL
  Filled 2021-01-20: qty 2

## 2021-01-20 MED ORDER — SODIUM CHLORIDE 0.9 % IV SOLN
Freq: Once | INTRAVENOUS | Status: AC
  Filled 2021-01-20: qty 250

## 2021-02-14 ENCOUNTER — Other Ambulatory Visit: Payer: Self-pay | Admitting: *Deleted

## 2021-02-14 DIAGNOSIS — G35 Multiple sclerosis: Secondary | ICD-10-CM

## 2021-02-17 ENCOUNTER — Inpatient Hospital Stay: Payer: Medicare Other

## 2021-02-17 ENCOUNTER — Telehealth: Payer: Self-pay

## 2021-02-17 ENCOUNTER — Ambulatory Visit: Payer: Medicare Other

## 2021-02-17 ENCOUNTER — Inpatient Hospital Stay (HOSPITAL_BASED_OUTPATIENT_CLINIC_OR_DEPARTMENT_OTHER): Payer: Medicare Other | Admitting: Oncology

## 2021-02-17 ENCOUNTER — Telehealth: Payer: Self-pay | Admitting: Oncology

## 2021-02-17 ENCOUNTER — Other Ambulatory Visit: Payer: Self-pay

## 2021-02-17 ENCOUNTER — Encounter: Payer: Self-pay | Admitting: Oncology

## 2021-02-17 ENCOUNTER — Inpatient Hospital Stay: Payer: Medicare Other | Attending: Oncology

## 2021-02-17 VITALS — BP 114/75 | HR 73

## 2021-02-17 VITALS — BP 110/74 | HR 98 | Temp 96.3°F | Resp 18 | Wt 213.8 lb

## 2021-02-17 DIAGNOSIS — G35 Multiple sclerosis: Secondary | ICD-10-CM

## 2021-02-17 DIAGNOSIS — Z79899 Other long term (current) drug therapy: Secondary | ICD-10-CM

## 2021-02-17 LAB — CBC WITH DIFFERENTIAL/PLATELET
Abs Immature Granulocytes: 0.04 10*3/uL (ref 0.00–0.07)
Basophils Absolute: 0.1 10*3/uL (ref 0.0–0.1)
Basophils Relative: 1 %
Eosinophils Absolute: 0.2 10*3/uL (ref 0.0–0.5)
Eosinophils Relative: 2 %
HCT: 39 % (ref 39.0–52.0)
Hemoglobin: 13.8 g/dL (ref 13.0–17.0)
Immature Granulocytes: 1 %
Lymphocytes Relative: 38 %
Lymphs Abs: 2.9 10*3/uL (ref 0.7–4.0)
MCH: 33.3 pg (ref 26.0–34.0)
MCHC: 35.4 g/dL (ref 30.0–36.0)
MCV: 94 fL (ref 80.0–100.0)
Monocytes Absolute: 0.9 10*3/uL (ref 0.1–1.0)
Monocytes Relative: 12 %
Neutro Abs: 3.5 10*3/uL (ref 1.7–7.7)
Neutrophils Relative %: 46 %
Platelets: 271 10*3/uL (ref 150–400)
RBC: 4.15 MIL/uL — ABNORMAL LOW (ref 4.22–5.81)
RDW: 13.1 % (ref 11.5–15.5)
WBC: 7.6 10*3/uL (ref 4.0–10.5)
nRBC: 1.2 % — ABNORMAL HIGH (ref 0.0–0.2)

## 2021-02-17 LAB — COMPREHENSIVE METABOLIC PANEL
ALT: 29 U/L (ref 0–44)
AST: 24 U/L (ref 15–41)
Albumin: 3.9 g/dL (ref 3.5–5.0)
Alkaline Phosphatase: 86 U/L (ref 38–126)
Anion gap: 7 (ref 5–15)
BUN: 18 mg/dL (ref 6–20)
CO2: 23 mmol/L (ref 22–32)
Calcium: 8.9 mg/dL (ref 8.9–10.3)
Chloride: 104 mmol/L (ref 98–111)
Creatinine, Ser: 0.78 mg/dL (ref 0.61–1.24)
GFR, Estimated: >60 mL/min (ref >60)
Glucose, Bld: 136 mg/dL — ABNORMAL HIGH (ref 70–99)
Potassium: 3.9 mmol/L (ref 3.5–5.1)
Sodium: 134 mmol/L — ABNORMAL LOW (ref 135–145)
Total Bilirubin: 0.7 mg/dL (ref 0.3–1.2)
Total Protein: 7.6 g/dL (ref 6.5–8.1)

## 2021-02-17 MED ORDER — SODIUM CHLORIDE 0.9 % IV SOLN
Freq: Once | INTRAVENOUS | Status: AC
  Filled 2021-02-17: qty 250

## 2021-02-17 MED ORDER — SODIUM CHLORIDE 0.9 % IV SOLN
300.0000 mg | Freq: Once | INTRAVENOUS | Status: AC
  Administered 2021-02-17: 300 mg via INTRAVENOUS
  Filled 2021-02-17: qty 15

## 2021-02-17 MED ORDER — ACETAMINOPHEN 500 MG PO TABS
1000.0000 mg | ORAL_TABLET | Freq: Once | ORAL | Status: AC
  Administered 2021-02-17: 1000 mg via ORAL

## 2021-02-17 NOTE — Telephone Encounter (Signed)
Called [facility] neurology at [phone]. Left a voicemail asking them if they are going to put more time between infusions due to elevated JCV per Dr Smith Robert. As well as when his next provider and lab appt is. I left our callback number and fax number.

## 2021-02-17 NOTE — Progress Notes (Signed)
Hematology/Oncology Consult note [facility]  Telephone:([phone] Fax:([phone]  Patient Care Team: Selina Cooley, MD as PCP - General (Family Medicine) Lemar Lofty, MD as Referring Physician (Neurology) Rosey Bath, MD as Referring Physician (Hematology and Oncology)   Name of the patient: Jerry Bautista  [id number]  20-Jul-1976   Date of visit: 02/17/21  Diagnosis-history of multiple sclerosis on Tysabri  Chief complaint/ Reason for visit-you are to receive next dose of Tysabri  Heme/Onc history: Jerry Bautista is a 45 y.o. male with multiple sclerosis diagnosed in 2000.  He presented with diplopia.  He previously received steroids, and Avonex/Rebif (interferon beta-1a).  He has been on Tysabri (last 08/05/2020)   Head MRI on 12/26/2017 revealed no change moderate cerebral and cerebellar signal abnormalities consistent with the patient's history of multiple sclerosis. Many lesions demonstrate decreased T1 signal.  There was no change moderate atrophy.  There was no abnormal enhancement to confirm active demyelination. There was no MR evidence of PML.  Head MRI  on 04/03/2020 revealed findings c/w chronic multiple sclerosis, affecting the brainstem, middle cerebellar peduncles and cerebellar hemispheric white matter. There was chronic involvement and volume loss of the corpus callosum and cerebral hemispheric white matter, particularly pronounced in the parietal regions. There were no areas of restricted diffusion or contrast enhancement to suggest active demyelination. Tthe findings are similar to 04/05/2019.   Cervical and thoracic spine MRI on 12/26/2017 revealed mild/moderate patchy T2 signal abnormalities in the cervical and thoracic spinal cord, c/w the patient's history of multiple sclerosis. There was no abnormal enhancement to confirm active demyelination.   JC antibody was negative (index 0.10) on 11/23/2017 and 0.23 (indeterminate)  on 01/31/2019.  JCV antibody by inhibition was negative on 02/09/2019.  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    Interval history-patient reports doing well and denies any significant issues with his vision or gait.  He is tolerating Tysabri well and has been getting it every 6 months  ECOG PS- 1 Pain scale-0  Review of systems- Review of Systems  Constitutional:  Negative for chills, fever, malaise/fatigue and weight loss.  HENT:  Negative for congestion, ear discharge and nosebleeds.   Eyes:  Negative for blurred vision.  Respiratory:  Negative for cough, hemoptysis, sputum production, shortness of breath and wheezing.   Cardiovascular:  Negative for chest pain, palpitations, orthopnea and claudication.  Gastrointestinal:  Negative for abdominal pain, blood in stool, constipation, diarrhea, heartburn, melena, nausea and vomiting.  Genitourinary:  Negative for dysuria, flank pain, frequency, hematuria and urgency.  Musculoskeletal:  Negative for back pain, joint pain and myalgias.  Skin:  Negative for rash.  Neurological:  Negative for dizziness, tingling, focal weakness, seizures, weakness and headaches.  Endo/Heme/Allergies:  Does not bruise/bleed easily.  Psychiatric/Behavioral:  Negative for depression and suicidal ideas. The patient does not have insomnia.      No Known Allergies   Past Medical History:  Diagnosis Date   Hypertension    MS (multiple sclerosis) (HCC)      No past surgical history on file.  Social History   Socioeconomic History   Marital status: Single    Spouse name: Not on file   Number of children: Not on file   Years of education: Not on file   Highest education level: Not on file  Occupational History   Not on file  Tobacco Use   Smoking status:  Never   Smokeless tobacco: Never  Vaping Use   Vaping Use: Never used  Substance and Sexual Activity   Alcohol use: Not  on file   Drug use: Not on file   Sexual activity: Not on file  Other Topics Concern   Not on file  Social History Narrative   Not on file   Social Determinants of Health   Financial Resource Strain: Not on file  Food Insecurity: Not on file  Transportation Needs: Not on file  Physical Activity: Not on file  Stress: Not on file  Social Connections: Not on file  Intimate Partner Violence: Not on file    No family history on file.   Current Outpatient Medications:    baclofen (LIORESAL) 10 MG tablet, Take 10 mg by mouth 2 (two) times a day., Disp: , Rfl:    dalfampridine 10 MG TB12, Take 10 mg by mouth 2 (two) times daily., Disp: , Rfl:    ferrous fumarate (HEMOCYTE - 106 MG FE) 325 (106 FE) MG TABS tablet, Take 1 tablet by mouth., Disp: , Rfl:    lisinopril (ZESTRIL) 10 MG tablet, Take by mouth., Disp: , Rfl:    lisinopril-hydrochlorothiazide (ZESTORETIC) 10-12.5 MG tablet, Take 1 tablet by mouth daily., Disp: , Rfl:    Multiple Vitamin (MULTIVITAMIN) tablet, Take 1 tablet by mouth daily., Disp: , Rfl:    Omega-3 Fatty Acids (FISH OIL) 1000 MG CAPS, Take 1 capsule by mouth daily., Disp: , Rfl:    Vitamin D, Ergocalciferol, (DRISDOL) 1.25 MG (50000 UNIT) CAPS capsule, Take 50,000 Units by mouth every 7 (seven) days., Disp: , Rfl:  No current facility-administered medications for this visit.  Facility-Administered Medications Ordered in Other Visits:    natalizumab (TYSABRI) 300 mg in sodium chloride 0.9 % 100 mL IVPB, 300 mg, Intravenous, Once, Jeralyn Ruths, MD  Physical exam:  Vitals:   02/17/21 0913 02/17/21 0916  BP:  110/74  Pulse:  98  Resp:  18  Temp:  (!) 96.3 F (35.7 C)  TempSrc:  Tympanic  SpO2:  98%  Weight: 213 lb 13.5 oz (97 kg)    Physical Exam Constitutional:      General: He is not in acute distress. Cardiovascular:     Rate and Rhythm: Normal rate and regular rhythm.     Heart sounds: Normal heart sounds.  Pulmonary:     Effort: Pulmonary  effort is normal.     Breath sounds: Normal breath sounds.  Abdominal:     General: Bowel sounds are normal.     Palpations: Abdomen is soft.  Skin:    General: Skin is warm and dry.  Neurological:     Mental Status: He is alert and oriented to person, place, and time.     CMP Latest Ref Rng & Units 02/17/2021  Glucose 70 - 99 mg/dL 161(W)  BUN 6 - 20 mg/dL 18  Creatinine 9.60 - 4.54 mg/dL 0.98  Sodium 119 - 147 mmol/L 134(L)  Potassium 3.5 - 5.1 mmol/L 3.9  Chloride 98 - 111 mmol/L 104  CO2 22 - 32 mmol/L 23  Calcium 8.9 - 10.3 mg/dL 8.9  Total Protein 6.5 - 8.1 g/dL 7.6  Total Bilirubin 0.3 - 1.2 mg/dL 0.7  Alkaline Phos 38 - 126 U/L 86  AST 15 - 41 U/L 24  ALT 0 - 44 U/L 29   CBC Latest Ref Rng & Units 02/17/2021  WBC 4.0 - 10.5 K/uL 7.6  Hemoglobin 13.0 - 17.0 g/dL  13.8  Hematocrit 39.0 - 52.0 % 39.0  Platelets 150 - 400 K/uL 271     Assessment and plan- Patient is a 45 y.o. male with history of multiple sclerosis currently on Tysabri here to receive next dose of Tysabri  Patient evaluated by neurology at [facility] on February 2022 And plan at that time was to continue Tysabri.  They are monitoring his JCV titers which are mildly elevated but overall improved as compared to prior.  Clinically patient seems to be doing well after starting Tysabri with improvement in his vision and balance.  He will receive to [facility] and continue to receive it every 6 months as per his previous prescription.  We will also reach out to his neurology team at [facility] to confirm the frequency of Tysabri.  I will see him back in 6 months with labs   Visit Diagnosis 1. Multiple sclerosis (HCC)   2. High risk medication use      Dr. Owens Shark, MD, MPH [facility] [phone] 02/17/2021 9:56 AM

## 2021-02-17 NOTE — Telephone Encounter (Signed)
Left VM with patient to make him aware of change to future appts (LOS 7/20 updated). Sending new AVS in the mail.

## 2021-02-17 NOTE — Patient Instructions (Signed)
[facility]  Discharge Instructions: Thank you for choosing [facility] to provide your oncology and hematology care.  If you have a lab appointment with the Cancer Center, please go directly to the Cancer Center and check in at the registration area.  Wear comfortable clothing and clothing appropriate for easy access to any Portacath or PICC line.   We strive to give you quality time with your provider. You may need to reschedule your appointment if you arrive late (15 or more minutes).  Arriving late affects you and other patients whose appointments are after yours.  Also, if you miss three or more appointments without notifying the office, you may be dismissed from the clinic at the provider's discretion.      For prescription refill requests, have your pharmacy contact our office and allow 72 hours for refills to be completed.    Today you received the following chemotherapy and/or immunotherapy agents       To help prevent nausea and vomiting after your treatment, we encourage you to take your nausea medication as directed.  BELOW ARE SYMPTOMS THAT SHOULD BE REPORTED IMMEDIATELY: *FEVER GREATER THAN 100.4 F (38 C) OR HIGHER *CHILLS OR SWEATING *NAUSEA AND VOMITING THAT IS NOT CONTROLLED WITH YOUR NAUSEA MEDICATION *UNUSUAL SHORTNESS OF BREATH *UNUSUAL BRUISING OR BLEEDING *URINARY PROBLEMS (pain or burning when urinating, or frequent urination) *BOWEL PROBLEMS (unusual diarrhea, constipation, pain near the anus) TENDERNESS IN MOUTH AND THROAT WITH OR WITHOUT PRESENCE OF ULCERS (sore throat, sores in mouth, or a toothache) UNUSUAL RASH, SWELLING OR PAIN  UNUSUAL VAGINAL DISCHARGE OR ITCHING   Items with * indicate a potential emergency and should be followed up as soon as possible or go to the Emergency Department if any problems should occur.  Please show the CHEMOTHERAPY ALERT CARD or IMMUNOTHERAPY ALERT CARD at check-in to the Emergency  Department and triage nurse.  Should you have questions after your visit or need to cancel or reschedule your appointment, please contact [facility] [place] [facility]  [phone] and follow the prompts.  Office hours are 8:00 a.m. to 4:30 p.m. Monday - Friday. Please note that voicemails left after 4:00 p.m. may not be returned until the following business day.  We are closed weekends and major holidays. You have access to a nurse at all times for urgent questions. Please call the main number to the clinic [phone] and follow the prompts.  For any non-urgent questions, you may also contact your provider using MyChart. We now offer e-Visits for anyone 18 and older to request care online for non-urgent symptoms. For details visit [email or web address].   Also download the MyChart app! Go to the app store, search "MyChart", open the app, select [place], and log in with your MyChart username and password.  Due to Covid, a mask is required upon entering the hospital/clinic. If you do not have a mask, one will be given to you upon arrival. For doctor visits, patients may have 1 support person aged 18 or older with them. For treatment visits, patients cannot have anyone with them due to current Covid guidelines and our immunocompromised population.  

## 2021-03-17 ENCOUNTER — Inpatient Hospital Stay: Payer: Medicare Other | Attending: Oncology

## 2021-03-17 ENCOUNTER — Other Ambulatory Visit: Payer: Self-pay

## 2021-03-17 ENCOUNTER — Ambulatory Visit: Payer: Medicare Other

## 2021-03-17 VITALS — BP 116/80 | HR 71 | Temp 97.0°F | Resp 18

## 2021-03-17 DIAGNOSIS — G35 Multiple sclerosis: Secondary | ICD-10-CM | POA: Insufficient documentation

## 2021-03-17 MED ORDER — SODIUM CHLORIDE 0.9 % IV SOLN
300.0000 mg | Freq: Once | INTRAVENOUS | Status: AC
  Administered 2021-03-17: 300 mg via INTRAVENOUS
  Filled 2021-03-17: qty 15

## 2021-03-17 MED ORDER — ACETAMINOPHEN 500 MG PO TABS
1000.0000 mg | ORAL_TABLET | Freq: Once | ORAL | Status: AC
  Administered 2021-03-17: 1000 mg via ORAL
  Filled 2021-03-17: qty 2

## 2021-03-17 MED ORDER — SODIUM CHLORIDE 0.9 % IV SOLN
Freq: Once | INTRAVENOUS | Status: AC
  Filled 2021-03-17: qty 250

## 2021-03-17 NOTE — Progress Notes (Signed)
Pt tolerated all infusions well today with no problems or complaints.  Pt left infusion suite stable and ambulatory.  

## 2021-03-17 NOTE — Patient Instructions (Signed)
Natalizumab injection What is this medication? NATALIZUMAB (na ta LIZ you mab) is used to treat relapsing multiple sclerosis.This drug is not a cure. It is also used to treat Crohn's disease. This medicine may be used for other purposes; ask your health care provider orpharmacist if you have questions. COMMON BRAND NAME(S): Tysabri What should I tell my care team before I take this medication? They need to know if you have any of these conditions: immune system problems progressive multifocal leukoencephalopathy (PML) an unusual or allergic reaction to natalizumab, other medicines, foods, dyes, or preservatives pregnant or trying to get pregnant breast-feeding How should I use this medication? This medicine is for infusion into a vein. It is given by a health careprofessional in a hospital or clinic setting. A special MedGuide will be given to you by the pharmacist with eachprescription and refill. Be sure to read this information carefully each time. Talk to your pediatrician regarding the use of this medicine in children. Thismedicine is not approved for use in children. Overdosage: If you think you have taken too much of this medicine contact apoison control center or emergency room at once. NOTE: This medicine is only for you. Do not share this medicine with others. What if I miss a dose? It is important not to miss your dose. Call your doctor or health careprofessional if you are unable to keep an appointment. What may interact with this medication? Do not take this medicine with any of the following medications: biologic medicines such as adalimumab, certolizumab, etanercept, golimumab, infliximab This medicine may also interact with the following medications: azathioprine cyclosporine interferons 6-mercaptopurine methotrexate other medicines that lower your chance of fighting an infection steroid medicines like prednisone or cortisone vaccines This list may not describe all  possible interactions. Give your health care provider a list of all the medicines, herbs, non-prescription drugs, or dietary supplements you use. Also tell them if you smoke, drink alcohol, or use illegaldrugs. Some items may interact with your medicine. What should I watch for while using this medication? Your condition will be monitored carefully while you are receiving this medicine. Visit your doctor for regular check ups. Tell your doctor or healthcare professional if your symptoms do not start to get better or if theyget worse. Stay away from people who are sick. Call your doctor or health care professional for advice if you get a fever, chills or sore throat, or othersymptoms of a cold or flu. Do not treat yourself. In some patients, this medicine may cause a serious brain infection that may cause death. If you have any problems seeing, thinking, speaking, walking, or standing, tell your doctor right away. If you cannot reach your doctor, geturgent medical care. What side effects may I notice from receiving this medication? Side effects that you should report to your doctor or health care professionalas soon as possible: allergic reactions like skin rash, itching or hives, swelling of the face, lips, or tongue breathing problems changes in vision chest pain confusion depressed mood dizziness feeling faint; lightheaded; falls general ill feeling or flu-like symptoms loss of memory missed menstrual periods muscle weakness problems with balance, talking, or walking signs and symptoms of liver injury like dark yellow or brown urine; general ill feeling or flu-like symptoms; light-colored stools; loss of appetite; nausea; right upper belly pain; unusually weak or tired; yellowing of the eyes or skin suicidal thoughts, mood changes unusual bruising or bleeding unusually weak or tired Side effects that usually do not require medical attention (report   to yourdoctor or health care professional  if they continue or are bothersome): headache joint pain muscle cramps muscle pain nausea, vomiting pain, redness, or irritation at site where injected tiredness This list may not describe all possible side effects. Call your doctor for medical advice about side effects. You may report side effects to FDA at1-800-FDA-1088. Where should I keep my medication? This drug is given in a hospital or clinic and will not be stored at home. NOTE: This sheet is a summary. It may not cover all possible information. If you have questions about this medicine, talk to your doctor, pharmacist, orhealth care provider.  2022 Elsevier/Gold Standard (2019-01-21 13:20:26)  

## 2021-04-14 ENCOUNTER — Inpatient Hospital Stay: Payer: Medicare Other | Attending: Oncology

## 2021-04-14 ENCOUNTER — Ambulatory Visit: Payer: Medicare Other

## 2021-04-14 ENCOUNTER — Other Ambulatory Visit: Payer: Self-pay

## 2021-04-14 VITALS — BP 104/69 | HR 71 | Temp 96.9°F | Resp 16

## 2021-04-14 DIAGNOSIS — G35 Multiple sclerosis: Secondary | ICD-10-CM | POA: Insufficient documentation

## 2021-04-14 MED ORDER — SODIUM CHLORIDE 0.9 % IV SOLN
300.0000 mg | Freq: Once | INTRAVENOUS | Status: AC
  Administered 2021-04-14: 300 mg via INTRAVENOUS
  Filled 2021-04-14: qty 15

## 2021-04-14 MED ORDER — ACETAMINOPHEN 500 MG PO TABS
1000.0000 mg | ORAL_TABLET | Freq: Once | ORAL | Status: AC
  Administered 2021-04-14: 1000 mg via ORAL
  Filled 2021-04-14: qty 2

## 2021-04-14 MED ORDER — SODIUM CHLORIDE 0.9 % IV SOLN
Freq: Once | INTRAVENOUS | Status: AC
  Filled 2021-04-14: qty 250

## 2021-04-27 ENCOUNTER — Other Ambulatory Visit: Payer: Self-pay | Admitting: *Deleted

## 2021-04-28 ENCOUNTER — Other Ambulatory Visit: Payer: Self-pay

## 2021-04-28 DIAGNOSIS — G35 Multiple sclerosis: Secondary | ICD-10-CM

## 2021-05-12 ENCOUNTER — Ambulatory Visit: Payer: Medicare Other

## 2021-05-12 ENCOUNTER — Ambulatory Visit: Payer: Medicare Other | Admitting: Oncology

## 2021-05-12 ENCOUNTER — Other Ambulatory Visit: Payer: Medicare Other

## 2021-05-26 ENCOUNTER — Inpatient Hospital Stay: Payer: Medicare Other | Attending: Oncology

## 2021-05-26 ENCOUNTER — Inpatient Hospital Stay (HOSPITAL_BASED_OUTPATIENT_CLINIC_OR_DEPARTMENT_OTHER): Payer: Medicare Other | Admitting: Oncology

## 2021-05-26 ENCOUNTER — Other Ambulatory Visit: Payer: Medicare Other

## 2021-05-26 ENCOUNTER — Ambulatory Visit: Payer: Medicare Other | Admitting: Oncology

## 2021-05-26 ENCOUNTER — Ambulatory Visit: Payer: Medicare Other

## 2021-05-26 ENCOUNTER — Encounter: Payer: Self-pay | Admitting: Oncology

## 2021-05-26 ENCOUNTER — Other Ambulatory Visit: Payer: Self-pay

## 2021-05-26 VITALS — BP 114/71 | HR 92 | Temp 98.4°F | Resp 16 | Ht 73.0 in | Wt 221.0 lb

## 2021-05-26 DIAGNOSIS — G35 Multiple sclerosis: Secondary | ICD-10-CM

## 2021-05-26 DIAGNOSIS — Z79899 Other long term (current) drug therapy: Secondary | ICD-10-CM

## 2021-05-26 LAB — COMPREHENSIVE METABOLIC PANEL
ALT: 29 U/L (ref 0–44)
AST: 19 U/L (ref 15–41)
Albumin: 4.4 g/dL (ref 3.5–5.0)
Alkaline Phosphatase: 88 U/L (ref 38–126)
Anion gap: 6 (ref 5–15)
BUN: 17 mg/dL (ref 6–20)
CO2: 27 mmol/L (ref 22–32)
Calcium: 9.2 mg/dL (ref 8.9–10.3)
Chloride: 100 mmol/L (ref 98–111)
Creatinine, Ser: 0.84 mg/dL (ref 0.61–1.24)
GFR, Estimated: >60 mL/min (ref >60)
Glucose, Bld: 118 mg/dL — ABNORMAL HIGH (ref 70–99)
Potassium: 4 mmol/L (ref 3.5–5.1)
Sodium: 133 mmol/L — ABNORMAL LOW (ref 135–145)
Total Bilirubin: 0.8 mg/dL (ref 0.3–1.2)
Total Protein: 7.8 g/dL (ref 6.5–8.1)

## 2021-05-26 LAB — CBC WITH DIFFERENTIAL/PLATELET
Abs Immature Granulocytes: 0.04 10*3/uL (ref 0.00–0.07)
Basophils Absolute: 0.1 10*3/uL (ref 0.0–0.1)
Basophils Relative: 1 %
Eosinophils Absolute: 0.2 10*3/uL (ref 0.0–0.5)
Eosinophils Relative: 2 %
HCT: 42.5 % (ref 39.0–52.0)
Hemoglobin: 15.1 g/dL (ref 13.0–17.0)
Immature Granulocytes: 1 %
Lymphocytes Relative: 34 %
Lymphs Abs: 2.8 10*3/uL (ref 0.7–4.0)
MCH: 33.7 pg (ref 26.0–34.0)
MCHC: 35.5 g/dL (ref 30.0–36.0)
MCV: 94.9 fL (ref 80.0–100.0)
Monocytes Absolute: 1.1 10*3/uL — ABNORMAL HIGH (ref 0.1–1.0)
Monocytes Relative: 13 %
Neutro Abs: 4.1 10*3/uL (ref 1.7–7.7)
Neutrophils Relative %: 49 %
Platelets: 250 10*3/uL (ref 150–400)
RBC: 4.48 MIL/uL (ref 4.22–5.81)
RDW: 13.2 % (ref 11.5–15.5)
WBC: 8.3 10*3/uL (ref 4.0–10.5)
nRBC: 1.7 % — ABNORMAL HIGH (ref 0.0–0.2)

## 2021-05-26 NOTE — Progress Notes (Signed)
Hematology/Oncology Consult note [facility]  Telephone:([phone] Fax:([phone]  Patient Care Team: Selina Cooley, MD as PCP - General (Family Medicine) Lemar Lofty, MD as Referring Physician (Neurology)   Name of the patient: Jerry Bautista  [id number]  1976-06-15   Date of visit: 05/26/21  Diagnosis-history of multiple sclerosis on Tysabri  Chief complaint/ Reason for visit-on treatment assessment prior to next dose of Tysabri  Heme/Onc history: Jerry Bautista is a 45 y.o. male with multiple sclerosis diagnosed in 2000.  He presented with diplopia.  He previously received steroids, and Avonex/Rebif (interferon beta-1a).  He has been on Tysabri (last 08/05/2020)   Head MRI on 12/26/2017 revealed no change moderate cerebral and cerebellar signal abnormalities consistent with the patient's history of multiple sclerosis. Many lesions demonstrate decreased T1 signal.  There was no change moderate atrophy.  There was no abnormal enhancement to confirm active demyelination. There was no MR evidence of PML.  Head MRI  on 04/03/2020 revealed findings c/w chronic multiple sclerosis, affecting the brainstem, middle cerebellar peduncles and cerebellar hemispheric white matter. There was chronic involvement and volume loss of the corpus callosum and cerebral hemispheric white matter, particularly pronounced in the parietal regions. There were no areas of restricted diffusion or contrast enhancement to suggest active demyelination. Tthe findings are similar to 04/05/2019.   Cervical and thoracic spine MRI on 12/26/2017 revealed mild/moderate patchy T2 signal abnormalities in the cervical and thoracic spinal cord, c/w the patient's history of multiple sclerosis. There was no abnormal enhancement to confirm active demyelination.   JC antibody was negative (index 0.10) on 11/23/2017 and 0.23 (indeterminate) on 01/31/2019.  JCV antibody by inhibition was negative on  02/09/2019.  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      Interval history-patient reports doing well and tolerating Tysabri without any significant side effects.  Denies any focal tingling numbness or weakness.  Denies any problems with his gait or vision.  ECOG PS- 0 Pain scale- 0   Review of systems- Review of Systems  Constitutional:  Negative for chills, fever, malaise/fatigue and weight loss.  HENT:  Negative for congestion, ear discharge and nosebleeds.   Eyes:  Negative for blurred vision.  Respiratory:  Negative for cough, hemoptysis, sputum production, shortness of breath and wheezing.   Cardiovascular:  Negative for chest pain, palpitations, orthopnea and claudication.  Gastrointestinal:  Negative for abdominal pain, blood in stool, constipation, diarrhea, heartburn, melena, nausea and vomiting.  Genitourinary:  Negative for dysuria, flank pain, frequency, hematuria and urgency.  Musculoskeletal:  Negative for back pain, joint pain and myalgias.  Skin:  Negative for rash.  Neurological:  Negative for dizziness, tingling, focal weakness, seizures, weakness and headaches.  Endo/Heme/Allergies:  Does not bruise/bleed easily.  Psychiatric/Behavioral:  Negative for depression and suicidal ideas. The patient does not have insomnia.      No Known Allergies   Past Medical History:  Diagnosis Date   Hypertension    MS (multiple sclerosis) (HCC)      No past surgical history on file.  Social History   Socioeconomic History   Marital status: Single    Spouse name: Not on file   Number of children: Not on file   Years of education: Not on file   Highest education level: Not on file  Occupational History   Not on file  Tobacco Use   Smoking status: Never   Smokeless  tobacco: Never  Vaping Use   Vaping Use: Never used  Substance and Sexual Activity   Alcohol use: Not Currently   Drug use: Not  on file   Sexual activity: Not Currently  Other Topics Concern   Not on file  Social History Narrative   Not on file   Social Determinants of Health   Financial Resource Strain: Not on file  Food Insecurity: Not on file  Transportation Needs: Not on file  Physical Activity: Not on file  Stress: Not on file  Social Connections: Not on file  Intimate Partner Violence: Not on file    Family History  Problem Relation Age of Onset   Multiple sclerosis Brother    Multiple sclerosis Cousin    Multiple sclerosis Cousin      Current Outpatient Medications:    baclofen (LIORESAL) 10 MG tablet, Take 10 mg by mouth 2 (two) times a day., Disp: , Rfl:    dalfampridine 10 MG TB12, Take 10 mg by mouth 2 (two) times daily., Disp: , Rfl:    ferrous fumarate (HEMOCYTE - 106 MG FE) 325 (106 FE) MG TABS tablet, Take 1 tablet by mouth., Disp: , Rfl:    lisinopril (ZESTRIL) 10 MG tablet, 10 mg in the morning and at bedtime., Disp: , Rfl:    lisinopril-hydrochlorothiazide (ZESTORETIC) 10-12.5 MG tablet, Take 1 tablet by mouth daily., Disp: , Rfl:    Multiple Vitamin (MULTIVITAMIN) tablet, Take 1 tablet by mouth daily., Disp: , Rfl:    Omega-3 Fatty Acids (FISH OIL) 1000 MG CAPS, Take 1 capsule by mouth daily., Disp: , Rfl:    Vitamin D, Ergocalciferol, (DRISDOL) 1.25 MG (50000 UNIT) CAPS capsule, Take 50,000 Units by mouth every 7 (seven) days., Disp: , Rfl:   Physical exam:  Vitals:   05/26/21 1038  BP: 114/71  Pulse: 92  Resp: 16  Temp: 98.4 F (36.9 C)  TempSrc: Oral  Weight: 221 lb (100.2 kg)  Height: 6\' 1"  (1.854 m)   Physical Exam Constitutional:      General: He is not in acute distress. Cardiovascular:     Rate and Rhythm: Normal rate and regular rhythm.     Heart sounds: Normal heart sounds.  Pulmonary:     Effort: Pulmonary effort is normal.     Breath sounds: Normal breath sounds.  Abdominal:     General: Bowel sounds are normal.     Palpations: Abdomen is soft.   Skin:    General: Skin is warm and dry.  Neurological:     Mental Status: He is alert and oriented to person, place, and time.     CMP Latest Ref Rng & Units 05/26/2021  Glucose 70 - 99 mg/dL 05/28/2021)  BUN 6 - 20 mg/dL 17  Creatinine 643(P - 2.95 mg/dL 1.88  Sodium 4.16 - 606 mmol/L 133(L)  Potassium 3.5 - 5.1 mmol/L 4.0  Chloride 98 - 111 mmol/L 100  CO2 22 - 32 mmol/L 27  Calcium 8.9 - 10.3 mg/dL 9.2  Total Protein 6.5 - 8.1 g/dL 7.8  Total Bilirubin 0.3 - 1.2 mg/dL 0.8  Alkaline Phos 38 - 126 U/L 88  AST 15 - 41 U/L 19  ALT 0 - 44 U/L 29   CBC Latest Ref Rng & Units 05/26/2021  WBC 4.0 - 10.5 K/uL 8.3  Hemoglobin 13.0 - 17.0 g/dL 05/28/2021  Hematocrit 60.1 - 52.0 % 42.5  Platelets 150 - 400 K/uL 250     Assessment and plan- Patient is  a 45 y.o. male with history of multiple sclerosis on Tysabri here for on treatment assessment prior to next dose of Tysabri  Patient was last seen by [facility] neurology in May 2022 at that point plan was to continue Tysabri every 4 weeks with JC virus monitoring every 6 months.  Levels have been collected today and are currently pending.  He has bilateral lower extremity mild weakness which is chronic and stable.  He will get Tysabri today and continue monthly injections for the next 2 months and I will see him in 3 months with labs   Visit Diagnosis 1. Multiple sclerosis (HCC)   2. High risk medication use      Dr. Owens Shark, MD, MPH [facility] at [facility] [phone] 05/26/2021 1:23 PM

## 2021-05-26 NOTE — Progress Notes (Signed)
Pt here to check and see if he can get treatment. He did say he went today and got lab done for [facility] and we will wait for results

## 2021-05-27 ENCOUNTER — Inpatient Hospital Stay: Payer: Medicare Other

## 2021-05-27 VITALS — BP 111/74 | HR 70 | Temp 97.0°F | Resp 18 | Wt 219.0 lb

## 2021-05-27 DIAGNOSIS — G35 Multiple sclerosis: Secondary | ICD-10-CM | POA: Diagnosis not present

## 2021-05-27 MED ORDER — SODIUM CHLORIDE 0.9 % IV SOLN
Freq: Once | INTRAVENOUS | Status: AC
  Filled 2021-05-27: qty 250

## 2021-05-27 MED ORDER — ACETAMINOPHEN 500 MG PO TABS
1000.0000 mg | ORAL_TABLET | Freq: Once | ORAL | Status: AC
  Administered 2021-05-27: 1000 mg via ORAL

## 2021-05-27 MED ORDER — ACETAMINOPHEN 500 MG PO TABS
ORAL_TABLET | ORAL | Status: AC
  Filled 2021-05-27: qty 2

## 2021-05-27 MED ORDER — SODIUM CHLORIDE 0.9 % IV SOLN
300.0000 mg | Freq: Once | INTRAVENOUS | Status: AC
  Administered 2021-05-27: 300 mg via INTRAVENOUS
  Filled 2021-05-27: qty 15

## 2021-06-11 LAB — MISC LABCORP TEST (SEND OUT): Labcorp test code: 819372

## 2021-06-11 NOTE — Progress Notes (Signed)
[facility] Neurology in regards to pts JVC antibody titers and if they will be okay with continuing monthly inj based on his numbers; left a VM for the CMA to return call.

## 2021-06-14 ENCOUNTER — Telehealth: Payer: Self-pay

## 2021-06-14 ENCOUNTER — Other Ambulatory Visit: Payer: Self-pay

## 2021-06-14 NOTE — Telephone Encounter (Signed)
Spoke to [place] at [facility] at [phone] and asked for one their providers to reach back out to Dr. Smith Robert in regards to pts JCV antibody titers and if they are okay with continuing his monthly Tysibri injections. Ms. Helmut Muster stated she will send the provider a message and they should reach back out. Provided them with Dr. Lonie Peak phone number.

## 2021-06-17 ENCOUNTER — Telehealth: Payer: Self-pay | Admitting: *Deleted

## 2021-06-17 NOTE — Telephone Encounter (Signed)
Called pt because I got a phone call from shanna t [facility] and she states that pt should now be getting tysarbi every 5 weeks for 3 cycles and then change to every 6 weeks from now on. I told her that his next treatment should be 11/23 and we will move it out 1 extra week to make it be 5 weeks. We have changed his appt to 11/29 arrival 9:30 and then get his treatment. He is ok with the change. Also I have told him and he knew about it last visit about the possibility of [facility] closing 12/29. At this time I do not know what we are doing with the non oncology infusions. There are several people looking at variety of ideas but at this time unknown. Pt. Understands and I said that it could be that we may reach out with [facility] to see where else pt could get treatment. I did tell Devonne Doughty from [facility] when she called today about the possibility that we may not be able to give treatment after 12/29. I am waiting to get the new order from neurology and shanna was faxing it today

## 2021-06-23 ENCOUNTER — Ambulatory Visit: Payer: Medicare Other

## 2021-06-23 ENCOUNTER — Other Ambulatory Visit: Payer: Medicare Other

## 2021-06-29 ENCOUNTER — Other Ambulatory Visit: Payer: Self-pay | Admitting: Physical Therapy

## 2021-06-29 ENCOUNTER — Other Ambulatory Visit: Payer: Self-pay | Admitting: Neurology

## 2021-06-29 ENCOUNTER — Ambulatory Visit: Payer: Medicare Other

## 2021-06-29 ENCOUNTER — Other Ambulatory Visit: Payer: Medicare Other

## 2021-06-29 DIAGNOSIS — G35 Multiple sclerosis: Secondary | ICD-10-CM

## 2021-06-30 ENCOUNTER — Ambulatory Visit: Payer: Medicare Other

## 2021-06-30 ENCOUNTER — Other Ambulatory Visit: Payer: Medicare Other

## 2021-07-01 ENCOUNTER — Inpatient Hospital Stay: Payer: Medicare Other | Attending: Oncology

## 2021-07-01 ENCOUNTER — Other Ambulatory Visit: Payer: Self-pay

## 2021-07-01 ENCOUNTER — Inpatient Hospital Stay: Payer: Medicare Other

## 2021-07-01 VITALS — BP 117/68 | HR 88 | Temp 97.0°F | Resp 18 | Wt 215.6 lb

## 2021-07-01 DIAGNOSIS — Z5112 Encounter for antineoplastic immunotherapy: Secondary | ICD-10-CM | POA: Diagnosis present

## 2021-07-01 DIAGNOSIS — G35 Multiple sclerosis: Secondary | ICD-10-CM

## 2021-07-01 LAB — CBC WITH DIFFERENTIAL/PLATELET
Abs Immature Granulocytes: 0.07 10*3/uL (ref 0.00–0.07)
Basophils Absolute: 0.1 10*3/uL (ref 0.0–0.1)
Basophils Relative: 1 %
Eosinophils Absolute: 0.2 10*3/uL (ref 0.0–0.5)
Eosinophils Relative: 2 %
HCT: 39.9 % (ref 39.0–52.0)
Hemoglobin: 13.6 g/dL (ref 13.0–17.0)
Immature Granulocytes: 1 %
Lymphocytes Relative: 36 %
Lymphs Abs: 3 10*3/uL (ref 0.7–4.0)
MCH: 33 pg (ref 26.0–34.0)
MCHC: 34.1 g/dL (ref 30.0–36.0)
MCV: 96.8 fL (ref 80.0–100.0)
Monocytes Absolute: 1.2 10*3/uL — ABNORMAL HIGH (ref 0.1–1.0)
Monocytes Relative: 14 %
Neutro Abs: 4 10*3/uL (ref 1.7–7.7)
Neutrophils Relative %: 46 %
Platelets: 356 10*3/uL (ref 150–400)
RBC: 4.12 MIL/uL — ABNORMAL LOW (ref 4.22–5.81)
RDW: 13.1 % (ref 11.5–15.5)
WBC: 8.5 10*3/uL (ref 4.0–10.5)
nRBC: 2.4 % — ABNORMAL HIGH (ref 0.0–0.2)

## 2021-07-01 LAB — COMPREHENSIVE METABOLIC PANEL
ALT: 37 U/L (ref 0–44)
AST: 21 U/L (ref 15–41)
Albumin: 4.3 g/dL (ref 3.5–5.0)
Alkaline Phosphatase: 87 U/L (ref 38–126)
Anion gap: 8 (ref 5–15)
BUN: 20 mg/dL (ref 6–20)
CO2: 27 mmol/L (ref 22–32)
Calcium: 9.4 mg/dL (ref 8.9–10.3)
Chloride: 102 mmol/L (ref 98–111)
Creatinine, Ser: 0.88 mg/dL (ref 0.61–1.24)
GFR, Estimated: >60 mL/min (ref >60)
Glucose, Bld: 101 mg/dL — ABNORMAL HIGH (ref 70–99)
Potassium: 4.2 mmol/L (ref 3.5–5.1)
Sodium: 137 mmol/L (ref 135–145)
Total Bilirubin: 0.7 mg/dL (ref 0.3–1.2)
Total Protein: 7.9 g/dL (ref 6.5–8.1)

## 2021-07-01 MED ORDER — SODIUM CHLORIDE 0.9 % IV SOLN
Freq: Once | INTRAVENOUS | Status: AC
Start: 2021-07-01 — End: 2021-07-01
  Filled 2021-07-01: qty 250

## 2021-07-01 MED ORDER — ACETAMINOPHEN 500 MG PO TABS
1000.0000 mg | ORAL_TABLET | Freq: Once | ORAL | Status: AC
  Administered 2021-07-01: 1000 mg via ORAL
  Filled 2021-07-01: qty 2

## 2021-07-01 MED ORDER — SODIUM CHLORIDE 0.9 % IV SOLN
300.0000 mg | Freq: Once | INTRAVENOUS | Status: AC
  Administered 2021-07-01: 300 mg via INTRAVENOUS
  Filled 2021-07-01: qty 15

## 2021-07-19 ENCOUNTER — Ambulatory Visit
Admission: RE | Admit: 2021-07-19 | Discharge: 2021-07-19 | Disposition: A | Discharge status: Home / Self Care | Payer: Medicare Other | Source: Ambulatory Visit | Attending: Neurology | Admitting: Neurology

## 2021-07-19 ENCOUNTER — Other Ambulatory Visit: Payer: Self-pay

## 2021-07-19 DIAGNOSIS — G35 Multiple sclerosis: Secondary | ICD-10-CM | POA: Insufficient documentation

## 2021-07-19 MED ORDER — GADOBUTROL 1 MMOL/ML IV SOLN
10.0000 mL | Freq: Once | INTRAVENOUS | Status: AC | PRN
  Administered 2021-07-19: 10:00:00 10 mL via INTRAVENOUS

## 2021-07-29 ENCOUNTER — Emergency Department: Payer: Medicare Other

## 2021-07-29 ENCOUNTER — Emergency Department
Admission: EM | Admit: 2021-07-29 | Discharge: 2021-07-29 | Disposition: A | Discharge status: Home / Self Care | Payer: Medicare Other | Attending: Emergency Medicine | Admitting: Emergency Medicine

## 2021-07-29 ENCOUNTER — Other Ambulatory Visit: Payer: Self-pay

## 2021-07-29 DIAGNOSIS — W010XXA Fall on same level from slipping, tripping and stumbling without subsequent striking against object, initial encounter: Secondary | ICD-10-CM | POA: Insufficient documentation

## 2021-07-29 DIAGNOSIS — S63259A Unspecified dislocation of unspecified finger, initial encounter: Secondary | ICD-10-CM

## 2021-07-29 DIAGNOSIS — S6992XA Unspecified injury of left wrist, hand and finger(s), initial encounter: Secondary | ICD-10-CM | POA: Diagnosis present

## 2021-07-29 DIAGNOSIS — I10 Essential (primary) hypertension: Secondary | ICD-10-CM | POA: Insufficient documentation

## 2021-07-29 DIAGNOSIS — Z79899 Other long term (current) drug therapy: Secondary | ICD-10-CM | POA: Insufficient documentation

## 2021-07-29 DIAGNOSIS — S63281A Dislocation of proximal interphalangeal joint of left index finger, initial encounter: Secondary | ICD-10-CM | POA: Diagnosis not present

## 2021-07-29 MED ORDER — LIDOCAINE HCL (PF) 1 % IJ SOLN
5.0000 mL | Freq: Once | INTRAMUSCULAR | Status: AC
  Administered 2021-07-29: 20:00:00 5 mL
  Filled 2021-07-29: qty 5

## 2021-07-29 NOTE — Discharge Instructions (Signed)
-  Keep the splint on your finger for at least the next 72 hours. -Take Tylenol/ibuprofen as needed for pain.

## 2021-07-29 NOTE — ED Triage Notes (Signed)
Pt comes with c/o mechanical fall while at [employer]. Pt states he tripped and fell. No LOC or hitting of head. Pt states left pointer finger pain and fracture. VSS per EMs

## 2021-07-29 NOTE — ED Provider Notes (Signed)
[facility] Emergency Department Provider Note    ____________________________________________   Event Date/Time   First MD Initiated Contact with Patient 07/29/21 1621     (approximate)  I have reviewed the triage vital signs and the nursing notes.   HISTORY  Chief Complaint Fall   HPI Jerry Bautista is a 45 y.o. male, history of hypertension and multiple sclerosis, presents to the emergency department for evaluation of finger injury.  Reports a mechanical fall while at the [facility].  He states he tripped and try to catch himself with his hand.  Following the fall, he noticed that he had a deformity in his left index finger.  Denies fever/chills, pain to hand/wrist/forearm.  No numbness/tingling.   History limited by: No limitations.  Past Medical History:  Diagnosis Date   Hypertension    MS (multiple sclerosis) ([place])     Patient Active Problem List   Diagnosis Date Noted   Hypertension 03/25/2015   Multiple sclerosis ([place]) 02/20/2015   Gait instability 01/08/2013    History reviewed. No pertinent surgical history.  Prior to Admission medications   Medication Sig Start Date End Date Taking? Authorizing Provider  baclofen (LIORESAL) 10 MG tablet Take 10 mg by mouth 2 (two) times a day. 10/29/18   [provider]  dalfampridine 10 MG TB12 Take 10 mg by mouth 2 (two) times daily.    [provider]  ferrous fumarate (HEMOCYTE - 106 MG FE) 325 (106 FE) MG TABS tablet Take 1 tablet by mouth.    [provider]  lisinopril (ZESTRIL) 10 MG tablet 10 mg in the morning and at bedtime. 04/28/19   [provider]  lisinopril-hydrochlorothiazide (ZESTORETIC) 10-12.5 MG tablet Take 1 tablet by mouth daily. 07/09/20   [provider]  Multiple Vitamin (MULTIVITAMIN) tablet Take 1 tablet by mouth daily.    [provider]  Omega-3 Fatty Acids (FISH OIL) 1000 MG CAPS Take 1 capsule by mouth daily.     [provider]  Vitamin D, Ergocalciferol, (DRISDOL) 1.25 MG (50000 UNIT) CAPS capsule Take 50,000 Units by mouth every 7 (seven) days.    [provider]    Allergies Patient has no known allergies.  Family History  Problem Relation Age of Onset   Multiple sclerosis Brother    Multiple sclerosis Cousin    Multiple sclerosis Cousin     Social History Social History   Tobacco Use   Smoking status: Never   Smokeless tobacco: Never  Vaping Use   Vaping Use: Never used  Substance Use Topics   Alcohol use: Not Currently    Review of Systems Review of Systems  Constitutional:  Negative for chills and fever.  HENT:  Negative for ear pain and sore throat.   Eyes:  Negative for blurred vision.  Respiratory:  Negative for sputum production and shortness of breath.   Cardiovascular:  Negative for chest pain and leg swelling.  Gastrointestinal:  Negative for abdominal pain and vomiting.  Genitourinary:  Negative for dysuria, flank pain and hematuria.  Musculoskeletal:  Negative for myalgias.       Positive for finger pain.  Skin:  Negative for rash.  Neurological:  Negative for headaches.    10-point ROS otherwise negative. ____________________________________________   PHYSICAL EXAM:  VITAL SIGNS: ED Triage Vitals  Enc Vitals Group     BP 07/29/21 1611 (!) 130/96     Pulse Rate 07/29/21 1611 93     Resp 07/29/21 1611 18  Temp 07/29/21 1611 98.7 F (37.1 C)     Temp Source 07/29/21 1611 Oral     SpO2 07/29/21 1611 100 %     Weight --      Height --      Head Circumference --      Peak Flow --      Pain Score 07/29/21 1600 5     Pain Loc --      Pain Edu? --      Excl. in GC? --     Physical Exam Constitutional:      General: He is not in acute distress.    Appearance: Normal appearance. He is not ill-appearing.  HENT:     Head: Normocephalic.     Nose: Nose normal.     Mouth/Throat:     Mouth: Mucous membranes are moist.      Pharynx: Oropharynx is clear.  Eyes:     Conjunctiva/sclera: Conjunctivae normal.     Pupils: Pupils are equal, round, and reactive to light.  Cardiovascular:     Rate and Rhythm: Normal rate and regular rhythm.  Pulmonary:     Effort: Pulmonary effort is normal. No respiratory distress.     Breath sounds: Normal breath sounds. No wheezing, rhonchi or rales.  Abdominal:     General: Abdomen is flat. There is no distension.     Palpations: Abdomen is soft.  Musculoskeletal:        General: Normal range of motion.     Cervical back: Normal range of motion and neck supple.     Comments: Gross deformity noted to the left index finger.  Medially displaced at the PIP joint.  Small abrasion to the medial aspect of the finger.  Skin:    General: Skin is warm and dry.  Neurological:     General: No focal deficit present.     Mental Status: He is alert. Mental status is at baseline.  Psychiatric:        Mood and Affect: Mood normal.        Behavior: Behavior normal.        Thought Content: Thought content normal.        Judgment: Judgment normal.     ____________________________________________    LABS  (all labs ordered are listed, but only abnormal results are displayed)  Labs Reviewed - No data to display   ____________________________________________   EKG Not applicable.   ____________________________________________    RADIOLOGY I personally viewed and evaluated these images as part of my medical decision making, as well as reviewing the written report by the radiologist.  ED Provider Interpretation: I agree with the interpretation of the radiologist.  Dislocation of the left second proximal interphalangeal joint.  DG Hand Complete Left  Result Date: 07/29/2021 CLINICAL DATA:  Post reduction images. EXAM: LEFT HAND - COMPLETE 3+ VIEW COMPARISON:  July 29, 2021 (5:21 p.m.) FINDINGS: There is no evidence of fracture or dislocation. There is interval reduction of  the second left PIP joint dislocation seen on the prior exam. There is no evidence of arthropathy or other focal bone abnormality. Mild soft tissue swelling is seen along the proximal aspect of the second left finger. IMPRESSION: Interval reduction of the second left PIP joint dislocation seen on the prior exam. Electronically Signed   By: Aram Candela M.D.   On: 07/29/2021 20:01   DG Hand Complete Left  Result Date: 07/29/2021 CLINICAL DATA:  Trip and fall injury to the left index  finger. EXAM: LEFT HAND - COMPLETE 3+ VIEW COMPARISON:  None. FINDINGS: There is complete dorsal dislocation of the middle phalanx of the left second finger with respect to the proximal phalanx. Associated deformity and soft tissue swelling. No discrete fractures are identified. IMPRESSION: Dislocation of the left second proximal interphalangeal joint. Electronically Signed   By: Lucienne Capers M.D.   On: 07/29/2021 17:50    ____________________________________________   PROCEDURES  Procedures   Medications  lidocaine (PF) (XYLOCAINE) 1 % injection 5 mL (5 mLs Infiltration Given by Other 07/29/21 1940)    Critical Care performed: No  ____________________________________________   INITIAL IMPRESSION / ASSESSMENT AND PLAN / ED COURSE  Pertinent labs & imaging results that were available during my care of the patient were reviewed by me and considered in my medical decision making (see chart for details).       Jerry Bautista is a 45 y.o. male, history of hypertension and multiple sclerosis, presents to the emergency department for evaluation of finger injury.  Reports a mechanical fall while at the [facility].  He states he tripped and try to catch himself with his hand.  Following the fall, he noticed that he had a deformity in his left index finger.  Denies fever/chills, pain to hand/wrist/forearm.  No numbness/tingling.   Patient appears well. Gross deformity noted to the left index finger.   Medially displaced at the PIP joint.  Small abrasion to the medial aspect of the finger.  Normal cap refill.  Sensation intact.  Patient was provided local anesthesia with lidocaine utilizing a intrathecal ring block injection.  The patient's finger was then reduced without any immediate complications.  Post reduction x-ray shows successful reduction and no fractures.  A splint was applied to the patient's finger.  We will discharge this patient with anticipatory guidance and return precautions.  Encouraged the patient to return to the emergency department anytime if he begins to experience new or worsening symptoms.      Clinical Course as of 07/29/21 2034  Thu Jul 29, 2021  1929 SpO2: 100 % [BS]    Clinical Course User Index [BS] Teodoro Spray, PA    ____________________________________________   FINAL CLINICAL IMPRESSION(S) / ED DIAGNOSES  Final diagnoses:  Dislocation of finger, initial encounter     NEW MEDICATIONS STARTED DURING THIS VISIT:  ED Discharge Orders     None        Note:  This document was prepared using Dragon voice recognition software and may include unintentional dictation errors.    Teodoro Spray, [place] 07/29/21 2034    Vladimir Crofts, MD 07/30/21 [phone]

## 2021-08-05 ENCOUNTER — Ambulatory Visit
Admission: RE | Admit: 2021-08-05 | Discharge: 2021-08-05 | Disposition: A | Discharge status: Home / Self Care | Payer: Medicare Other | Source: Ambulatory Visit | Attending: Neurology | Admitting: Neurology

## 2021-08-05 ENCOUNTER — Other Ambulatory Visit: Payer: Self-pay

## 2021-08-05 DIAGNOSIS — G35 Multiple sclerosis: Secondary | ICD-10-CM | POA: Insufficient documentation

## 2021-08-05 MED ORDER — SODIUM CHLORIDE 0.9 % IV SOLN
300.0000 mg | Freq: Once | INTRAVENOUS | Status: AC
  Administered 2021-08-05: 300 mg via INTRAVENOUS
  Filled 2021-08-05: qty 15

## 2021-08-05 MED ORDER — DIPHENHYDRAMINE HCL 25 MG PO CAPS
ORAL_CAPSULE | ORAL | Status: AC
  Filled 2021-08-05: qty 1

## 2021-08-05 MED ORDER — ACETAMINOPHEN 325 MG PO TABS
650.0000 mg | ORAL_TABLET | Freq: Once | ORAL | Status: AC
  Administered 2021-08-05: 650 mg via ORAL

## 2021-08-05 MED ORDER — ACETAMINOPHEN 325 MG PO TABS
ORAL_TABLET | ORAL | Status: AC
  Filled 2021-08-05: qty 2

## 2021-08-05 MED ORDER — DIPHENHYDRAMINE HCL 25 MG PO CAPS
25.0000 mg | ORAL_CAPSULE | Freq: Once | ORAL | Status: AC
  Administered 2021-08-05: 25 mg via ORAL

## 2021-08-05 MED ORDER — SODIUM CHLORIDE 0.9 % IV SOLN: INTRAVENOUS | Status: DC

## 2021-08-17 LAB — MISC LABCORP TEST (SEND OUT): Labcorp test code: 819372

## 2021-08-25 ENCOUNTER — Ambulatory Visit: Payer: Medicare Other

## 2021-08-25 ENCOUNTER — Ambulatory Visit: Payer: Medicare Other | Admitting: Oncology

## 2021-08-25 ENCOUNTER — Other Ambulatory Visit: Payer: Medicare Other

## 2021-08-26 ENCOUNTER — Ambulatory Visit: Payer: Medicare Other

## 2021-08-27 ENCOUNTER — Ambulatory Visit: Payer: Medicare Other

## 2021-08-27 ENCOUNTER — Ambulatory Visit: Payer: Medicare Other | Admitting: Oncology

## 2021-08-27 ENCOUNTER — Other Ambulatory Visit: Payer: Medicare Other

## 2021-08-27 ENCOUNTER — Ambulatory Visit: Admission: RE | Admit: 2021-08-27 | Payer: Medicare Other | Source: Ambulatory Visit

## 2021-09-09 ENCOUNTER — Ambulatory Visit
Admission: RE | Admit: 2021-09-09 | Discharge: 2021-09-09 | Disposition: A | Discharge status: Home / Self Care | Payer: Medicare Other | Source: Ambulatory Visit | Attending: Neurology | Admitting: Neurology

## 2021-09-09 DIAGNOSIS — R760 Raised antibody titer: Secondary | ICD-10-CM | POA: Insufficient documentation

## 2021-09-09 MED ORDER — DIPHENHYDRAMINE HCL 25 MG PO CAPS
ORAL_CAPSULE | ORAL | Status: AC
  Administered 2021-09-09: 25 mg
  Filled 2021-09-09: qty 1

## 2021-09-09 MED ORDER — ACETAMINOPHEN 500 MG PO TABS
ORAL_TABLET | ORAL | Status: AC
  Administered 2021-09-09: 1000 mg
  Filled 2021-09-09: qty 2

## 2021-09-09 MED ORDER — SODIUM CHLORIDE 0.9 % IV SOLN: INTRAVENOUS | Status: DC

## 2021-09-09 MED ORDER — ACETAMINOPHEN 325 MG PO TABS: 650.0000 mg | ORAL_TABLET | Freq: Once | ORAL | Status: AC

## 2021-09-09 MED ORDER — DIPHENHYDRAMINE HCL 25 MG PO CAPS: 25.0000 mg | ORAL_CAPSULE | Freq: Once | ORAL | Status: AC

## 2021-09-09 MED ORDER — SODIUM CHLORIDE 0.9 % IV SOLN
300.0000 mg | Freq: Once | INTRAVENOUS | Status: AC
  Administered 2021-09-09: 300 mg via INTRAVENOUS
  Filled 2021-09-09: qty 15

## 2021-09-09 MED ORDER — DIPHENHYDRAMINE HCL 50 MG/ML IJ SOLN
INTRAMUSCULAR | Status: AC
  Filled 2021-09-09: qty 1

## 2021-09-09 NOTE — Discharge Instructions (Signed)
Your Follow up infusion will be on March 16,2023  at 10:00   Our phone same day floor number is [phone]

## 2021-09-09 NOTE — Progress Notes (Signed)
Patient tysabri infusion competed. Patient is alert and oriented,VS is WNL  and has no signs of acute distress.

## 2021-10-14 ENCOUNTER — Ambulatory Visit
Admission: RE | Admit: 2021-10-14 | Discharge: 2021-10-14 | Disposition: A | Discharge status: Home / Self Care | Payer: Medicare Other | Source: Ambulatory Visit | Attending: Neurology | Admitting: Neurology

## 2021-10-14 DIAGNOSIS — G35 Multiple sclerosis: Secondary | ICD-10-CM | POA: Diagnosis not present

## 2021-10-14 DIAGNOSIS — R760 Raised antibody titer: Secondary | ICD-10-CM | POA: Insufficient documentation

## 2021-10-14 MED ORDER — ACETAMINOPHEN 325 MG PO TABS
ORAL_TABLET | ORAL | Status: AC
  Administered 2021-10-14: 650 mg via ORAL
  Filled 2021-10-14: qty 2

## 2021-10-14 MED ORDER — DIPHENHYDRAMINE HCL 25 MG PO CAPS: 25.0000 mg | ORAL_CAPSULE | Freq: Four times a day (QID) | ORAL | Status: AC | PRN

## 2021-10-14 MED ORDER — DIPHENHYDRAMINE HCL 25 MG PO CAPS
ORAL_CAPSULE | ORAL | Status: AC
  Administered 2021-10-14: 25 mg via ORAL
  Filled 2021-10-14: qty 1

## 2021-10-14 MED ORDER — SODIUM CHLORIDE 0.9 % IV SOLN: INTRAVENOUS | Status: DC

## 2021-10-14 MED ORDER — SODIUM CHLORIDE 0.9 % IV SOLN
300.0000 mg | Freq: Once | INTRAVENOUS | Status: AC
  Administered 2021-10-14: 300 mg via INTRAVENOUS
  Filled 2021-10-14: qty 15

## 2021-10-14 MED ORDER — ACETAMINOPHEN 325 MG PO TABS: 650.0000 mg | ORAL_TABLET | Freq: Once | ORAL | Status: AC

## 2021-11-25 ENCOUNTER — Ambulatory Visit
Admission: RE | Admit: 2021-11-25 | Discharge: 2021-11-25 | Disposition: A | Discharge status: Home / Self Care | Payer: Medicare Other | Source: Ambulatory Visit | Attending: Neurology | Admitting: Neurology

## 2021-11-25 VITALS — BP 119/89 | HR 69 | Temp 98.1°F | Resp 18

## 2021-11-25 DIAGNOSIS — G35 Multiple sclerosis: Secondary | ICD-10-CM | POA: Diagnosis present

## 2021-11-25 LAB — CBC WITH DIFFERENTIAL/PLATELET
Abs Immature Granulocytes: 0.02 10*3/uL (ref 0.00–0.07)
Basophils Absolute: 0.1 10*3/uL (ref 0.0–0.1)
Basophils Relative: 2 %
Eosinophils Absolute: 0.1 10*3/uL (ref 0.0–0.5)
Eosinophils Relative: 2 %
HCT: 42.3 % (ref 39.0–52.0)
Hemoglobin: 14.6 g/dL (ref 13.0–17.0)
Immature Granulocytes: 0 %
Lymphocytes Relative: 40 %
Lymphs Abs: 2.6 10*3/uL (ref 0.7–4.0)
MCH: 32.2 pg (ref 26.0–34.0)
MCHC: 34.5 g/dL (ref 30.0–36.0)
MCV: 93.2 fL (ref 80.0–100.0)
Monocytes Absolute: 0.8 10*3/uL (ref 0.1–1.0)
Monocytes Relative: 12 %
Neutro Abs: 3 10*3/uL (ref 1.7–7.7)
Neutrophils Relative %: 44 %
Platelets: 250 10*3/uL (ref 150–400)
RBC: 4.54 MIL/uL (ref 4.22–5.81)
RDW: 13.2 % (ref 11.5–15.5)
WBC: 6.6 10*3/uL (ref 4.0–10.5)
nRBC: 0.9 % — ABNORMAL HIGH (ref 0.0–0.2)

## 2021-11-25 LAB — COMPREHENSIVE METABOLIC PANEL
ALT: 40 U/L (ref 0–44)
AST: 28 U/L (ref 15–41)
Albumin: 4 g/dL (ref 3.5–5.0)
Alkaline Phosphatase: 87 U/L (ref 38–126)
Anion gap: 6 (ref 5–15)
BUN: 18 mg/dL (ref 6–20)
CO2: 26 mmol/L (ref 22–32)
Calcium: 9.2 mg/dL (ref 8.9–10.3)
Chloride: 104 mmol/L (ref 98–111)
Creatinine, Ser: 0.99 mg/dL (ref 0.61–1.24)
GFR, Estimated: >60 mL/min (ref >60)
Glucose, Bld: 104 mg/dL — ABNORMAL HIGH (ref 70–99)
Potassium: 3.7 mmol/L (ref 3.5–5.1)
Sodium: 136 mmol/L (ref 135–145)
Total Bilirubin: 0.8 mg/dL (ref 0.3–1.2)
Total Protein: 7.4 g/dL (ref 6.5–8.1)

## 2021-11-25 MED ORDER — ACETAMINOPHEN 325 MG PO TABS
650.0000 mg | ORAL_TABLET | Freq: Once | ORAL | Status: AC
  Administered 2021-11-25: 650 mg via ORAL

## 2021-11-25 MED ORDER — SODIUM CHLORIDE 0.9 % IV SOLN
300.0000 mg | INTRAVENOUS | Status: DC
  Administered 2021-11-25: 300 mg via INTRAVENOUS
  Filled 2021-11-25: qty 15

## 2021-11-25 MED ORDER — DIPHENHYDRAMINE HCL 25 MG PO CAPS
ORAL_CAPSULE | ORAL | Status: AC
  Filled 2021-11-25: qty 1

## 2021-11-25 MED ORDER — SODIUM CHLORIDE 0.9 % IV SOLN: INTRAVENOUS | Status: DC

## 2021-11-25 MED ORDER — ACETAMINOPHEN 325 MG PO TABS
ORAL_TABLET | ORAL | Status: AC
  Filled 2021-11-25: qty 1

## 2021-11-25 MED ORDER — DIPHENHYDRAMINE HCL 25 MG PO CAPS
25.0000 mg | ORAL_CAPSULE | Freq: Once | ORAL | Status: AC
  Administered 2021-11-25: 25 mg via ORAL

## 2021-12-24 ENCOUNTER — Encounter: Payer: Self-pay | Admitting: Oncology

## 2021-12-24 NOTE — Telephone Encounter (Signed)
See previous note 09/14/20

## 2022-01-06 ENCOUNTER — Ambulatory Visit
Admission: RE | Admit: 2022-01-06 | Discharge: 2022-01-06 | Disposition: A | Discharge status: Home / Self Care | Payer: Medicare Other | Source: Ambulatory Visit | Attending: Neurology | Admitting: Neurology

## 2022-01-06 VITALS — BP 115/84 | HR 80 | Temp 97.5°F | Resp 18 | Ht 73.0 in | Wt 180.0 lb

## 2022-01-06 DIAGNOSIS — G35 Multiple sclerosis: Secondary | ICD-10-CM | POA: Insufficient documentation

## 2022-01-06 MED ORDER — ACETAMINOPHEN 325 MG PO TABS
ORAL_TABLET | ORAL | Status: AC
  Filled 2022-01-06: qty 2

## 2022-01-06 MED ORDER — DIPHENHYDRAMINE HCL 25 MG PO CAPS
ORAL_CAPSULE | ORAL | Status: AC
  Filled 2022-01-06: qty 1

## 2022-01-06 MED ORDER — NATALIZUMAB 300 MG/15ML IV CONC
300.0000 mg | Freq: Once | INTRAVENOUS | Status: AC
  Administered 2022-01-06: 300 mg via INTRAVENOUS
  Filled 2022-01-06: qty 15

## 2022-01-06 MED ORDER — ACETAMINOPHEN 325 MG PO TABS
650.0000 mg | ORAL_TABLET | Freq: Once | ORAL | Status: AC
Start: 2022-01-06 — End: 2022-01-06
  Administered 2022-01-06: 650 mg via ORAL

## 2022-01-06 MED ORDER — DIPHENHYDRAMINE HCL 25 MG PO CAPS
25.0000 mg | ORAL_CAPSULE | Freq: Once | ORAL | Status: AC
  Administered 2022-01-06: 25 mg via ORAL

## 2022-01-07 ENCOUNTER — Ambulatory Visit: Payer: Medicare Other

## 2022-02-17 ENCOUNTER — Ambulatory Visit
Admission: RE | Admit: 2022-02-17 | Discharge: 2022-02-17 | Disposition: A | Discharge status: Home / Self Care | Payer: Medicare Other | Source: Ambulatory Visit | Attending: Neurology | Admitting: Neurology

## 2022-02-17 DIAGNOSIS — G35 Multiple sclerosis: Secondary | ICD-10-CM | POA: Insufficient documentation

## 2022-02-17 LAB — CBC WITH DIFFERENTIAL/PLATELET
Abs Immature Granulocytes: 0.02 10*3/uL (ref 0.00–0.07)
Basophils Absolute: 0.1 10*3/uL (ref 0.0–0.1)
Basophils Relative: 2 %
Eosinophils Absolute: 0.2 10*3/uL (ref 0.0–0.5)
Eosinophils Relative: 2 %
HCT: 41.2 % (ref 39.0–52.0)
Hemoglobin: 14.4 g/dL (ref 13.0–17.0)
Immature Granulocytes: 0 %
Lymphocytes Relative: 40 %
Lymphs Abs: 3.1 10*3/uL (ref 0.7–4.0)
MCH: 32.1 pg (ref 26.0–34.0)
MCHC: 35 g/dL (ref 30.0–36.0)
MCV: 91.8 fL (ref 80.0–100.0)
Monocytes Absolute: 0.9 10*3/uL (ref 0.1–1.0)
Monocytes Relative: 12 %
Neutro Abs: 3.4 10*3/uL (ref 1.7–7.7)
Neutrophils Relative %: 44 %
Platelets: 255 10*3/uL (ref 150–400)
RBC: 4.49 MIL/uL (ref 4.22–5.81)
RDW: 13.1 % (ref 11.5–15.5)
WBC: 7.8 10*3/uL (ref 4.0–10.5)
nRBC: 0.9 % — ABNORMAL HIGH (ref 0.0–0.2)

## 2022-02-17 LAB — COMPREHENSIVE METABOLIC PANEL
ALT: 27 U/L (ref 0–44)
AST: 23 U/L (ref 15–41)
Albumin: 4.1 g/dL (ref 3.5–5.0)
Alkaline Phosphatase: 81 U/L (ref 38–126)
Anion gap: 8 (ref 5–15)
BUN: 17 mg/dL (ref 6–20)
CO2: 24 mmol/L (ref 22–32)
Calcium: 9.3 mg/dL (ref 8.9–10.3)
Chloride: 104 mmol/L (ref 98–111)
Creatinine, Ser: 0.93 mg/dL (ref 0.61–1.24)
GFR, Estimated: >60 mL/min (ref >60)
Glucose, Bld: 130 mg/dL — ABNORMAL HIGH (ref 70–99)
Potassium: 3.8 mmol/L (ref 3.5–5.1)
Sodium: 136 mmol/L (ref 135–145)
Total Bilirubin: 1.1 mg/dL (ref 0.3–1.2)
Total Protein: 7.5 g/dL (ref 6.5–8.1)

## 2022-02-17 MED ORDER — SODIUM CHLORIDE 0.9 % IV SOLN
300.0000 mg | Freq: Once | INTRAVENOUS | Status: AC
  Administered 2022-02-17: 300 mg via INTRAVENOUS
  Filled 2022-02-17: qty 15

## 2022-02-17 MED ORDER — DIPHENHYDRAMINE HCL 25 MG PO CAPS
ORAL_CAPSULE | ORAL | Status: AC
  Administered 2022-02-17: 25 mg via ORAL
  Filled 2022-02-17: qty 1

## 2022-02-17 MED ORDER — ACETAMINOPHEN 325 MG PO TABS
ORAL_TABLET | ORAL | Status: AC
  Administered 2022-02-17: 650 mg via ORAL
  Filled 2022-02-17: qty 2

## 2022-02-17 MED ORDER — ACETAMINOPHEN 325 MG PO TABS: 650.0000 mg | ORAL_TABLET | Freq: Once | ORAL | Status: AC

## 2022-02-17 MED ORDER — DIPHENHYDRAMINE HCL 25 MG PO CAPS: 25.0000 mg | ORAL_CAPSULE | Freq: Once | ORAL | Status: AC

## 2022-02-21 ENCOUNTER — Encounter: Payer: Self-pay | Admitting: Oncology

## 2022-02-21 LAB — JC VIRUS DNA,PCR (WHOLE BLOOD): JC Virus DNA, PCR, Blood: NEGATIVE

## 2022-02-21 NOTE — Telephone Encounter (Signed)
Signing encounter , see note 08/27/20 

## 2022-04-04 ENCOUNTER — Ambulatory Visit: Payer: Medicare Other

## 2022-04-05 ENCOUNTER — Ambulatory Visit
Admission: RE | Admit: 2022-04-05 | Discharge: 2022-04-05 | Disposition: A | Discharge status: Home / Self Care | Payer: Medicare Other | Source: Ambulatory Visit | Attending: Neurology | Admitting: Neurology

## 2022-04-05 DIAGNOSIS — G35 Multiple sclerosis: Secondary | ICD-10-CM | POA: Diagnosis not present

## 2022-04-05 MED ORDER — ACETAMINOPHEN 325 MG PO TABS
ORAL_TABLET | ORAL | Status: AC
  Administered 2022-04-05: 650 mg via ORAL
  Filled 2022-04-05: qty 2

## 2022-04-05 MED ORDER — ACETAMINOPHEN 325 MG PO TABS: 650.0000 mg | ORAL_TABLET | Freq: Once | ORAL | Status: AC

## 2022-04-05 MED ORDER — DIPHENHYDRAMINE HCL 25 MG PO CAPS: 25.0000 mg | ORAL_CAPSULE | Freq: Once | ORAL | Status: AC

## 2022-04-05 MED ORDER — DIPHENHYDRAMINE HCL 25 MG PO CAPS
ORAL_CAPSULE | ORAL | Status: AC
  Administered 2022-04-05: 25 mg via ORAL
  Filled 2022-04-05: qty 1

## 2022-04-05 MED ORDER — SODIUM CHLORIDE 0.9 % IV SOLN: INTRAVENOUS | Status: DC

## 2022-04-05 MED ORDER — SODIUM CHLORIDE 0.9 % IV SOLN
300.0000 mg | Freq: Once | INTRAVENOUS | Status: AC
  Administered 2022-04-05: 300 mg via INTRAVENOUS
  Filled 2022-04-05: qty 15

## 2022-05-03 ENCOUNTER — Ambulatory Visit
Admission: RE | Admit: 2022-05-03 | Discharge: 2022-05-03 | Disposition: A | Discharge status: Home / Self Care | Payer: Medicare Other | Source: Ambulatory Visit | Attending: Neurology | Admitting: Neurology

## 2022-05-03 VITALS — BP 119/87 | HR 82 | Temp 97.1°F | Resp 16 | Ht 73.0 in | Wt 225.0 lb

## 2022-05-03 DIAGNOSIS — G35 Multiple sclerosis: Secondary | ICD-10-CM | POA: Insufficient documentation

## 2022-05-03 MED ORDER — SODIUM CHLORIDE 0.9 % IV SOLN
300.0000 mg | Freq: Once | INTRAVENOUS | Status: AC
  Administered 2022-05-03: 300 mg via INTRAVENOUS
  Filled 2022-05-03: qty 15

## 2022-05-03 MED ORDER — ACETAMINOPHEN 325 MG PO TABS
ORAL_TABLET | ORAL | Status: AC
  Filled 2022-05-03: qty 2

## 2022-05-03 MED ORDER — DIPHENHYDRAMINE HCL 25 MG PO CAPS: 25.0000 mg | ORAL_CAPSULE | Freq: Once | ORAL | Status: AC

## 2022-05-03 MED ORDER — DIPHENHYDRAMINE HCL 25 MG PO CAPS
ORAL_CAPSULE | ORAL | Status: AC
  Administered 2022-05-03: 25 mg
  Filled 2022-05-03: qty 1

## 2022-05-03 MED ORDER — ACETAMINOPHEN 325 MG PO TABS
650.0000 mg | ORAL_TABLET | Freq: Once | ORAL | Status: AC
  Administered 2022-05-03: 650 mg via ORAL

## 2022-05-31 ENCOUNTER — Ambulatory Visit
Admission: RE | Admit: 2022-05-31 | Discharge: 2022-05-31 | Disposition: A | Discharge status: Home / Self Care | Payer: Medicare Other | Source: Ambulatory Visit | Attending: Neurology | Admitting: Neurology

## 2022-05-31 DIAGNOSIS — R760 Raised antibody titer: Secondary | ICD-10-CM | POA: Diagnosis present

## 2022-05-31 MED ORDER — ACETAMINOPHEN 325 MG PO TABS: 650.0000 mg | ORAL_TABLET | Freq: Once | ORAL | Status: AC

## 2022-05-31 MED ORDER — DIPHENHYDRAMINE HCL 25 MG PO CAPS: 25.0000 mg | ORAL_CAPSULE | Freq: Once | ORAL | Status: AC

## 2022-05-31 MED ORDER — SODIUM CHLORIDE 0.9 % IV SOLN
300.0000 mg | Freq: Once | INTRAVENOUS | Status: AC
  Administered 2022-05-31: 300 mg via INTRAVENOUS
  Filled 2022-05-31: qty 15

## 2022-05-31 MED ORDER — ACETAMINOPHEN 325 MG PO TABS
ORAL_TABLET | ORAL | Status: AC
  Administered 2022-05-31: 650 mg via ORAL
  Filled 2022-05-31: qty 2

## 2022-05-31 MED ORDER — DIPHENHYDRAMINE HCL 25 MG PO CAPS
ORAL_CAPSULE | ORAL | Status: AC
  Administered 2022-05-31: 25 mg via ORAL
  Filled 2022-05-31: qty 1

## 2022-06-28 ENCOUNTER — Ambulatory Visit
Admission: RE | Admit: 2022-06-28 | Discharge: 2022-06-28 | Disposition: A | Discharge status: Home / Self Care | Payer: Medicare Other | Source: Ambulatory Visit | Attending: Family Medicine | Admitting: Family Medicine

## 2022-06-28 VITALS — BP 113/81 | HR 85 | Temp 98.7°F | Resp 16

## 2022-06-28 DIAGNOSIS — G35 Multiple sclerosis: Secondary | ICD-10-CM

## 2022-06-28 LAB — CBC WITH DIFFERENTIAL/PLATELET
Abs Immature Granulocytes: 0.04 10*3/uL (ref 0.00–0.07)
Basophils Absolute: 0.1 10*3/uL (ref 0.0–0.1)
Basophils Relative: 2 %
Eosinophils Absolute: 0.1 10*3/uL (ref 0.0–0.5)
Eosinophils Relative: 2 %
HCT: 40.1 % (ref 39.0–52.0)
Hemoglobin: 13.8 g/dL (ref 13.0–17.0)
Immature Granulocytes: 1 %
Lymphocytes Relative: 40 %
Lymphs Abs: 2.8 10*3/uL (ref 0.7–4.0)
MCH: 32.4 pg (ref 26.0–34.0)
MCHC: 34.4 g/dL (ref 30.0–36.0)
MCV: 94.1 fL (ref 80.0–100.0)
Monocytes Absolute: 0.8 10*3/uL (ref 0.1–1.0)
Monocytes Relative: 11 %
Neutro Abs: 3.2 10*3/uL (ref 1.7–7.7)
Neutrophils Relative %: 44 %
Platelets: 259 10*3/uL (ref 150–400)
RBC: 4.26 MIL/uL (ref 4.22–5.81)
RDW: 13.6 % (ref 11.5–15.5)
WBC: 7.1 10*3/uL (ref 4.0–10.5)
nRBC: 2.1 % — ABNORMAL HIGH (ref 0.0–0.2)

## 2022-06-28 MED ORDER — ACETAMINOPHEN 325 MG PO TABS
650.0000 mg | ORAL_TABLET | Freq: Once | ORAL | Status: AC
  Administered 2022-06-28: 650 mg via ORAL

## 2022-06-28 MED ORDER — DIPHENHYDRAMINE HCL 25 MG PO CAPS
ORAL_CAPSULE | ORAL | Status: AC
  Filled 2022-06-28: qty 1

## 2022-06-28 MED ORDER — ACETAMINOPHEN 325 MG PO TABS
ORAL_TABLET | ORAL | Status: AC
  Filled 2022-06-28: qty 2

## 2022-06-28 MED ORDER — SODIUM CHLORIDE 0.9 % IV SOLN
300.0000 mg | Freq: Once | INTRAVENOUS | Status: AC
  Administered 2022-06-28: 300 mg via INTRAVENOUS
  Filled 2022-06-28: qty 15

## 2022-06-28 MED ORDER — DIPHENHYDRAMINE HCL 25 MG PO CAPS
25.0000 mg | ORAL_CAPSULE | Freq: Once | ORAL | Status: AC
  Administered 2022-06-28: 25 mg via ORAL

## 2022-07-26 ENCOUNTER — Ambulatory Visit
Admission: RE | Admit: 2022-07-26 | Discharge: 2022-07-26 | Disposition: A | Discharge status: Home / Self Care | Payer: Medicare Other | Source: Ambulatory Visit | Attending: Neurology | Admitting: Neurology

## 2022-07-26 NOTE — Progress Notes (Addendum)
In reviewing patient's orders there were no active orders to administer infusion (last order dated 07/16/2021).  This RN called MD office and faxed request for new orders to be sent ASAP.  Informed patient, and charge nurse; awaiting directives/orders from MD -   Faxed Dr. Delfin Gant' office @ [phone]

## 2022-08-05 ENCOUNTER — Ambulatory Visit
Admission: RE | Admit: 2022-08-05 | Discharge: 2022-08-05 | Disposition: A | Discharge status: Home / Self Care | Payer: Medicare Other | Source: Ambulatory Visit | Attending: Neurology | Admitting: Neurology

## 2022-08-05 VITALS — BP 113/80 | HR 70 | Temp 98.1°F | Resp 16

## 2022-08-05 DIAGNOSIS — G35 Multiple sclerosis: Secondary | ICD-10-CM | POA: Insufficient documentation

## 2022-08-05 LAB — COMPREHENSIVE METABOLIC PANEL
ALT: 29 U/L (ref 0–44)
AST: 26 U/L (ref 15–41)
Albumin: 3.8 g/dL (ref 3.5–5.0)
Alkaline Phosphatase: 87 U/L (ref 38–126)
Anion gap: 5 (ref 5–15)
BUN: 19 mg/dL (ref 6–20)
CO2: 25 mmol/L (ref 22–32)
Calcium: 9 mg/dL (ref 8.9–10.3)
Chloride: 106 mmol/L (ref 98–111)
Creatinine, Ser: 0.86 mg/dL (ref 0.61–1.24)
GFR, Estimated: >60 mL/min (ref >60)
Glucose, Bld: 125 mg/dL — ABNORMAL HIGH (ref 70–99)
Potassium: 4.4 mmol/L (ref 3.5–5.1)
Sodium: 136 mmol/L (ref 135–145)
Total Bilirubin: 0.9 mg/dL (ref 0.3–1.2)
Total Protein: 7.5 g/dL (ref 6.5–8.1)

## 2022-08-05 LAB — CBC WITH DIFFERENTIAL/PLATELET
Abs Immature Granulocytes: 0.05 10*3/uL (ref 0.00–0.07)
Basophils Absolute: 0.2 10*3/uL — ABNORMAL HIGH (ref 0.0–0.1)
Basophils Relative: 2 %
Eosinophils Absolute: 0.2 10*3/uL (ref 0.0–0.5)
Eosinophils Relative: 2 %
HCT: 40.6 % (ref 39.0–52.0)
Hemoglobin: 14.1 g/dL (ref 13.0–17.0)
Immature Granulocytes: 1 %
Lymphocytes Relative: 39 %
Lymphs Abs: 3.2 10*3/uL (ref 0.7–4.0)
MCH: 32.3 pg (ref 26.0–34.0)
MCHC: 34.7 g/dL (ref 30.0–36.0)
MCV: 92.9 fL (ref 80.0–100.0)
Monocytes Absolute: 1 10*3/uL (ref 0.1–1.0)
Monocytes Relative: 13 %
Neutro Abs: 3.6 10*3/uL (ref 1.7–7.7)
Neutrophils Relative %: 43 %
Platelets: 361 10*3/uL (ref 150–400)
RBC: 4.37 MIL/uL (ref 4.22–5.81)
RDW: 12.8 % (ref 11.5–15.5)
WBC: 8.2 10*3/uL (ref 4.0–10.5)
nRBC: 1 % — ABNORMAL HIGH (ref 0.0–0.2)

## 2022-08-05 MED ORDER — DIPHENHYDRAMINE HCL 25 MG PO CAPS
ORAL_CAPSULE | ORAL | Status: AC
  Filled 2022-08-05: qty 1

## 2022-08-05 MED ORDER — DIPHENHYDRAMINE HCL 25 MG PO CAPS
25.0000 mg | ORAL_CAPSULE | Freq: Once | ORAL | Status: AC
  Administered 2022-08-05: 25 mg via ORAL

## 2022-08-05 MED ORDER — ACETAMINOPHEN 325 MG PO TABS
ORAL_TABLET | ORAL | Status: AC
  Filled 2022-08-05: qty 2

## 2022-08-05 MED ORDER — ACETAMINOPHEN 325 MG PO TABS
650.0000 mg | ORAL_TABLET | Freq: Once | ORAL | Status: AC
  Administered 2022-08-05: 650 mg via ORAL

## 2022-08-05 MED ORDER — SODIUM CHLORIDE 0.9 % IV SOLN
300.0000 mg | Freq: Once | INTRAVENOUS | Status: AC
  Administered 2022-08-05: 300 mg via INTRAVENOUS
  Filled 2022-08-05: qty 15

## 2022-08-30 ENCOUNTER — Encounter: Payer: Self-pay | Admitting: Oncology

## 2022-09-09 ENCOUNTER — Ambulatory Visit
Admission: RE | Admit: 2022-09-09 | Discharge: 2022-09-09 | Disposition: A | Discharge status: Home / Self Care | Payer: 59 | Source: Ambulatory Visit | Attending: Neurology | Admitting: Neurology

## 2022-09-09 DIAGNOSIS — G35 Multiple sclerosis: Secondary | ICD-10-CM | POA: Insufficient documentation

## 2022-09-09 MED ORDER — ACETAMINOPHEN 325 MG PO TABS
650.0000 mg | ORAL_TABLET | Freq: Once | ORAL | Status: AC
  Administered 2022-09-09: 650 mg via ORAL

## 2022-09-09 MED ORDER — ACETAMINOPHEN 325 MG PO TABS
ORAL_TABLET | ORAL | Status: AC
  Filled 2022-09-09: qty 2

## 2022-09-09 MED ORDER — DIPHENHYDRAMINE HCL 25 MG PO CAPS
25.0000 mg | ORAL_CAPSULE | Freq: Once | ORAL | Status: AC
  Administered 2022-09-09: 25 mg via ORAL

## 2022-09-09 MED ORDER — SODIUM CHLORIDE 0.9 % IV SOLN
300.0000 mg | Freq: Once | INTRAVENOUS | Status: AC
  Administered 2022-09-09: 300 mg via INTRAVENOUS
  Filled 2022-09-09: qty 15

## 2022-09-09 MED ORDER — DIPHENHYDRAMINE HCL 25 MG PO CAPS
ORAL_CAPSULE | ORAL | Status: AC
  Filled 2022-09-09: qty 1

## 2022-10-14 ENCOUNTER — Ambulatory Visit
Admission: RE | Admit: 2022-10-14 | Discharge: 2022-10-14 | Disposition: A | Discharge status: Home / Self Care | Payer: 59 | Source: Ambulatory Visit | Attending: Neurology | Admitting: Neurology

## 2022-10-14 VITALS — BP 115/78 | HR 75 | Temp 97.6°F | Resp 17 | Ht 73.0 in | Wt 200.0 lb

## 2022-10-14 DIAGNOSIS — G35 Multiple sclerosis: Secondary | ICD-10-CM | POA: Diagnosis present

## 2022-10-14 MED ORDER — ACETAMINOPHEN 325 MG PO TABS
ORAL_TABLET | ORAL | Status: AC
  Administered 2022-10-14: 650 mg via ORAL
  Filled 2022-10-14: qty 2

## 2022-10-14 MED ORDER — SODIUM CHLORIDE 0.9 % IV SOLN
300.0000 mg | Freq: Once | INTRAVENOUS | Status: AC
  Administered 2022-10-14: 300 mg via INTRAVENOUS
  Filled 2022-10-14: qty 15

## 2022-10-14 MED ORDER — ACETAMINOPHEN 325 MG PO TABS: 650.0000 mg | ORAL_TABLET | Freq: Once | ORAL | Status: AC

## 2022-10-14 MED ORDER — LORATADINE 10 MG PO TABS: 10.0000 mg | ORAL_TABLET | Freq: Every day | ORAL | Status: DC

## 2022-10-14 MED ORDER — DIPHENHYDRAMINE HCL 25 MG PO CAPS: 25.0000 mg | ORAL_CAPSULE | Freq: Once | ORAL | Status: AC

## 2022-10-14 MED ORDER — DIPHENHYDRAMINE HCL 25 MG PO CAPS
ORAL_CAPSULE | ORAL | Status: AC
  Administered 2022-10-14: 25 mg via ORAL
  Filled 2022-10-14: qty 1

## 2022-10-14 NOTE — Progress Notes (Signed)
Pt discharged home, iv discontinued. Pt refused to stay for 1 hr post his infusion. Vitals wnl. No distress noted. Continue to monitor.

## 2022-10-19 LAB — JC VIRUS DNA,PCR (WHOLE BLOOD): JC Virus DNA, PCR, Blood: NEGATIVE

## 2022-11-25 ENCOUNTER — Ambulatory Visit
Admission: RE | Admit: 2022-11-25 | Discharge: 2022-11-25 | Disposition: A | Discharge status: Home / Self Care | Payer: 59 | Source: Ambulatory Visit | Attending: Neurology | Admitting: Neurology

## 2022-11-25 VITALS — BP 116/86 | HR 69 | Temp 97.8°F | Resp 16 | Ht 73.0 in | Wt 225.0 lb

## 2022-11-25 DIAGNOSIS — G35 Multiple sclerosis: Secondary | ICD-10-CM | POA: Diagnosis present

## 2022-11-25 LAB — CBC WITH DIFFERENTIAL/PLATELET
Abs Immature Granulocytes: 0.04 10*3/uL (ref 0.00–0.07)
Basophils Absolute: 0.1 10*3/uL (ref 0.0–0.1)
Basophils Relative: 2 %
Eosinophils Absolute: 0.2 10*3/uL (ref 0.0–0.5)
Eosinophils Relative: 2 %
HCT: 43.2 % (ref 39.0–52.0)
Hemoglobin: 14.7 g/dL (ref 13.0–17.0)
Immature Granulocytes: 1 %
Lymphocytes Relative: 39 %
Lymphs Abs: 2.7 10*3/uL (ref 0.7–4.0)
MCH: 31.9 pg (ref 26.0–34.0)
MCHC: 34 g/dL (ref 30.0–36.0)
MCV: 93.7 fL (ref 80.0–100.0)
Monocytes Absolute: 0.8 10*3/uL (ref 0.1–1.0)
Monocytes Relative: 11 %
Neutro Abs: 3.1 10*3/uL (ref 1.7–7.7)
Neutrophils Relative %: 45 %
Platelets: 244 10*3/uL (ref 150–400)
RBC: 4.61 MIL/uL (ref 4.22–5.81)
RDW: 13.4 % (ref 11.5–15.5)
WBC: 6.9 10*3/uL (ref 4.0–10.5)
nRBC: 1.8 % — ABNORMAL HIGH (ref 0.0–0.2)

## 2022-11-25 LAB — COMPREHENSIVE METABOLIC PANEL
ALT: 29 U/L (ref 0–44)
AST: 23 U/L (ref 15–41)
Albumin: 3.9 g/dL (ref 3.5–5.0)
Alkaline Phosphatase: 99 U/L (ref 38–126)
Anion gap: 6 (ref 5–15)
BUN: 17 mg/dL (ref 6–20)
CO2: 27 mmol/L (ref 22–32)
Calcium: 9.1 mg/dL (ref 8.9–10.3)
Chloride: 102 mmol/L (ref 98–111)
Creatinine, Ser: 0.89 mg/dL (ref 0.61–1.24)
GFR, Estimated: >60 mL/min (ref >60)
Glucose, Bld: 119 mg/dL — ABNORMAL HIGH (ref 70–99)
Potassium: 4.5 mmol/L (ref 3.5–5.1)
Sodium: 135 mmol/L (ref 135–145)
Total Bilirubin: 0.7 mg/dL (ref 0.3–1.2)
Total Protein: 7.3 g/dL (ref 6.5–8.1)

## 2022-11-25 MED ORDER — DIPHENHYDRAMINE HCL 25 MG PO CAPS
ORAL_CAPSULE | ORAL | Status: AC
  Filled 2022-11-25: qty 1

## 2022-11-25 MED ORDER — LORATADINE 10 MG PO TABS: 10.0000 mg | ORAL_TABLET | Freq: Every day | ORAL | Status: DC

## 2022-11-25 MED ORDER — DIPHENHYDRAMINE HCL 25 MG PO CAPS
25.0000 mg | ORAL_CAPSULE | Freq: Once | ORAL | Status: AC
  Administered 2022-11-25: 25 mg via ORAL

## 2022-11-25 MED ORDER — SODIUM CHLORIDE 0.9 % IV SOLN
300.0000 mg | Freq: Once | INTRAVENOUS | Status: AC
  Administered 2022-11-25: 300 mg via INTRAVENOUS
  Filled 2022-11-25: qty 15

## 2022-11-25 MED ORDER — ACETAMINOPHEN 325 MG PO TABS
650.0000 mg | ORAL_TABLET | Freq: Once | ORAL | Status: AC
  Administered 2022-11-25: 650 mg via ORAL

## 2022-11-25 MED ORDER — ACETAMINOPHEN 325 MG PO TABS
ORAL_TABLET | ORAL | Status: AC
  Filled 2022-11-25: qty 2

## 2022-12-30 ENCOUNTER — Ambulatory Visit
Admission: RE | Admit: 2022-12-30 | Discharge: 2022-12-30 | Disposition: A | Discharge status: Home / Self Care | Payer: 59 | Source: Ambulatory Visit | Attending: Neurology | Admitting: Neurology

## 2022-12-30 DIAGNOSIS — G35 Multiple sclerosis: Secondary | ICD-10-CM | POA: Insufficient documentation

## 2022-12-30 MED ORDER — DIPHENHYDRAMINE HCL 25 MG PO CAPS
25.0000 mg | ORAL_CAPSULE | Freq: Once | ORAL | Status: AC
  Administered 2022-12-30: 25 mg via ORAL

## 2022-12-30 MED ORDER — ACETAMINOPHEN 325 MG PO TABS
650.0000 mg | ORAL_TABLET | Freq: Once | ORAL | Status: AC
  Administered 2022-12-30: 650 mg via ORAL

## 2022-12-30 MED ORDER — DIPHENHYDRAMINE HCL 25 MG PO CAPS
ORAL_CAPSULE | ORAL | Status: AC
  Filled 2022-12-30: qty 1

## 2022-12-30 MED ORDER — ACETAMINOPHEN 325 MG PO TABS
ORAL_TABLET | ORAL | Status: AC
  Filled 2022-12-30: qty 2

## 2022-12-30 MED ORDER — SODIUM CHLORIDE 0.9 % IV SOLN
300.0000 mg | Freq: Once | INTRAVENOUS | Status: AC
  Administered 2022-12-30: 300 mg via INTRAVENOUS
  Filled 2022-12-30: qty 15

## 2023-02-03 ENCOUNTER — Ambulatory Visit: Payer: 59

## 2023-04-27 IMAGING — DX DG HAND COMPLETE 3+V*L*
3 series · 3 of 3 positions shown · non-contrast
Comparison: [DATE] [DATE], [DATE] ([DATE] p.m.)

CLINICAL DATA: Post reduction images.

EXAM:
LEFT HAND - COMPLETE 3+ VIEW

[hand ap]
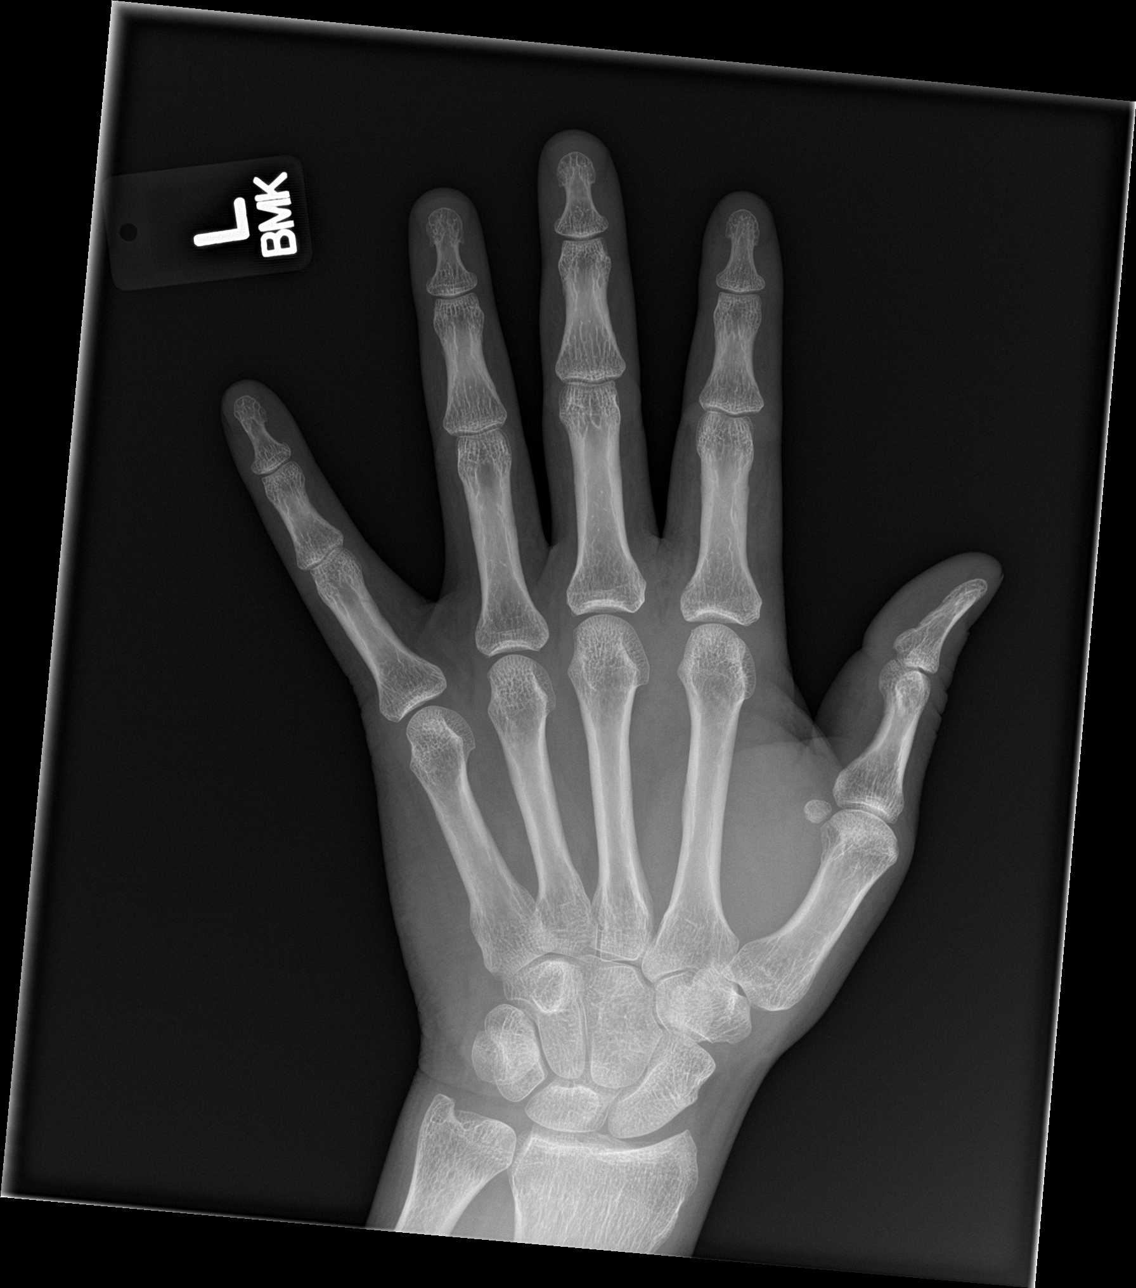

[hand obl]
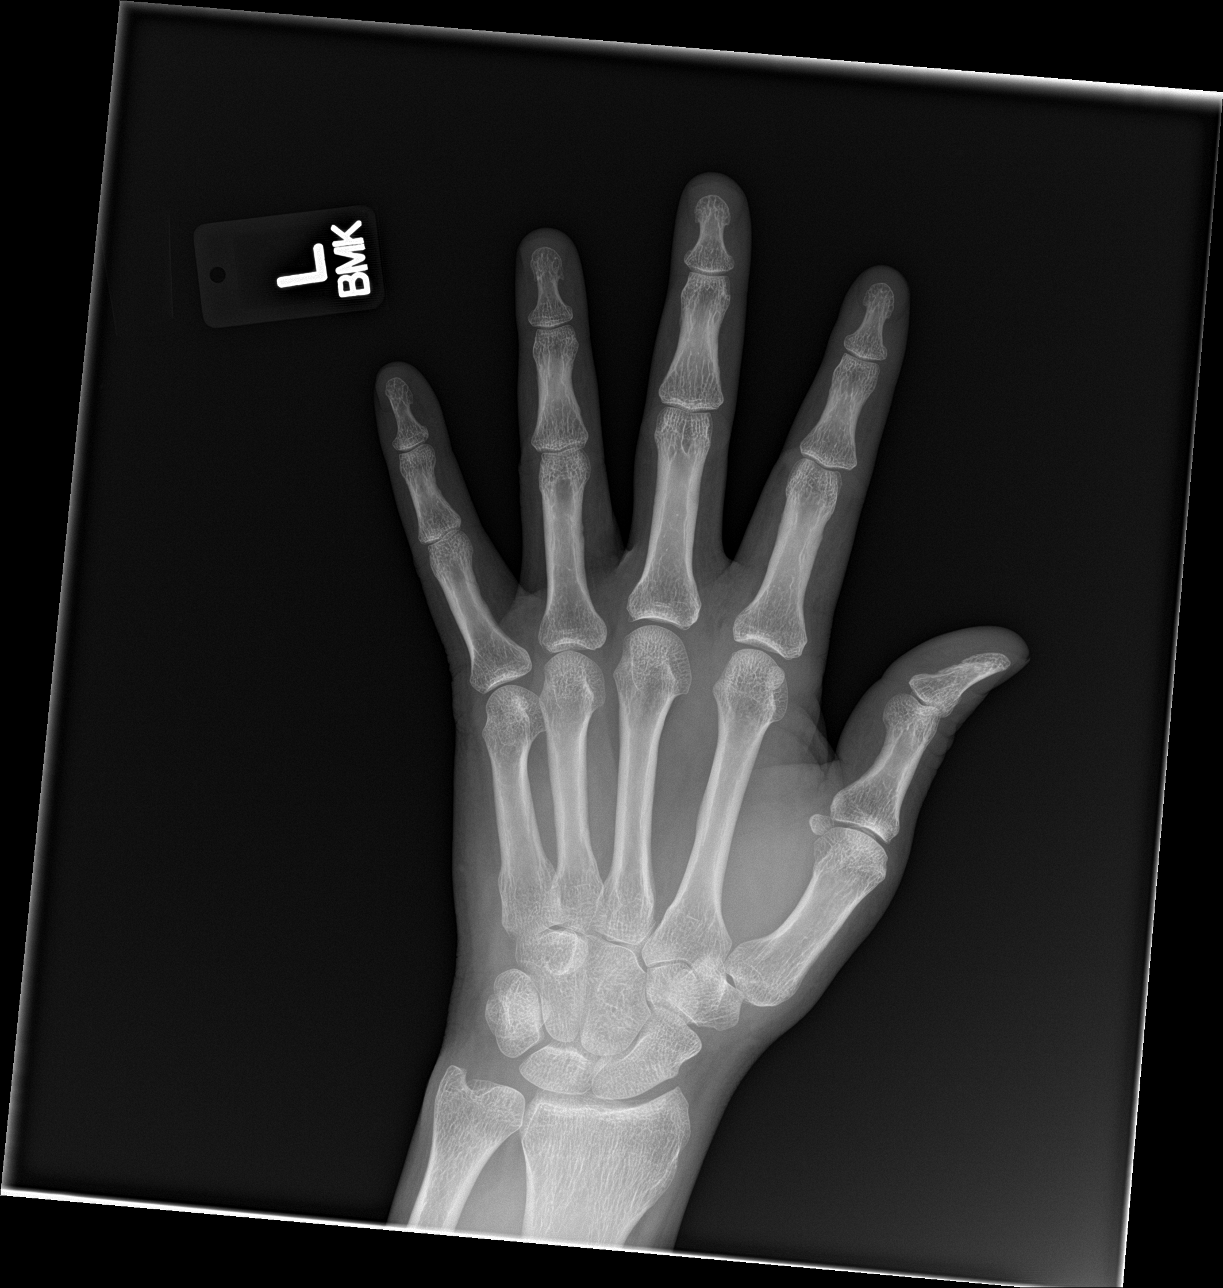

[hand lat]
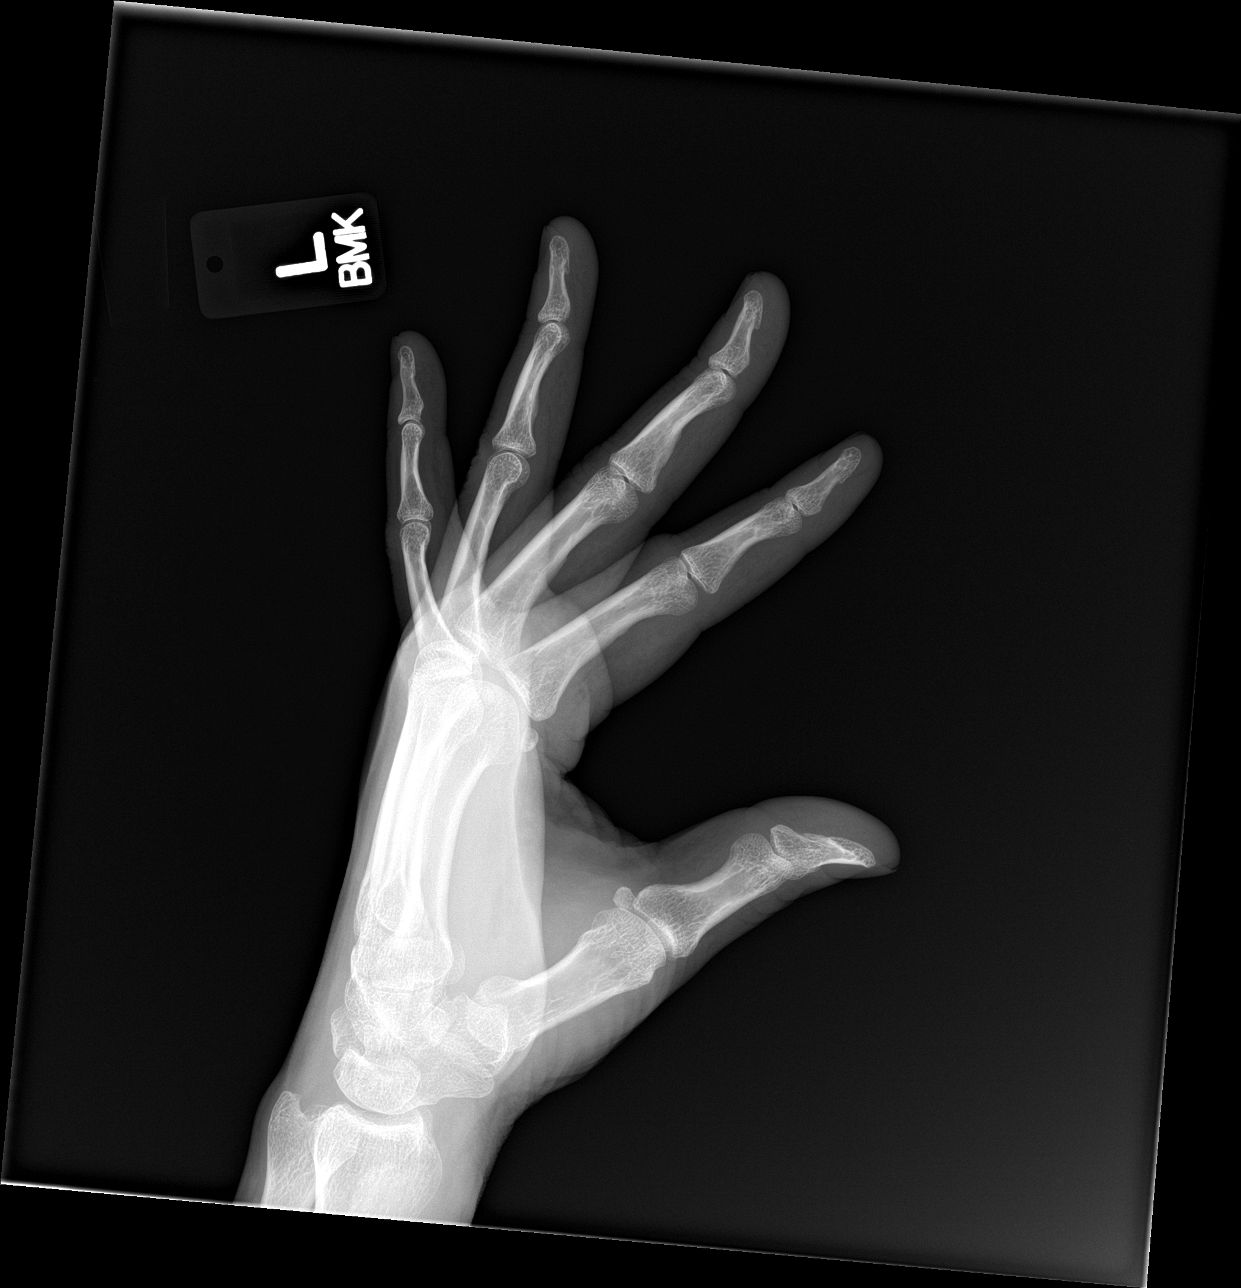

[3 of 3 positions shown; findings below may reference images not displayed]

FINDINGS: There is no evidence of fracture or dislocation. There is interval
reduction of the second left PIP joint dislocation seen on the prior
exam. There is no evidence of arthropathy or other focal bone
abnormality. Mild soft tissue swelling is seen along the proximal
aspect of the second left finger.
IMPRESSION: Interval reduction of the second left PIP joint dislocation seen on
the prior exam.

## 2023-04-27 IMAGING — DX DG HAND COMPLETE 3+V*L*
3 series · 3 of 3 positions shown · non-contrast
Comparison: None.

CLINICAL DATA: Trip and fall injury to the left index finger.

EXAM:
LEFT HAND - COMPLETE 3+ VIEW

[hand ap]
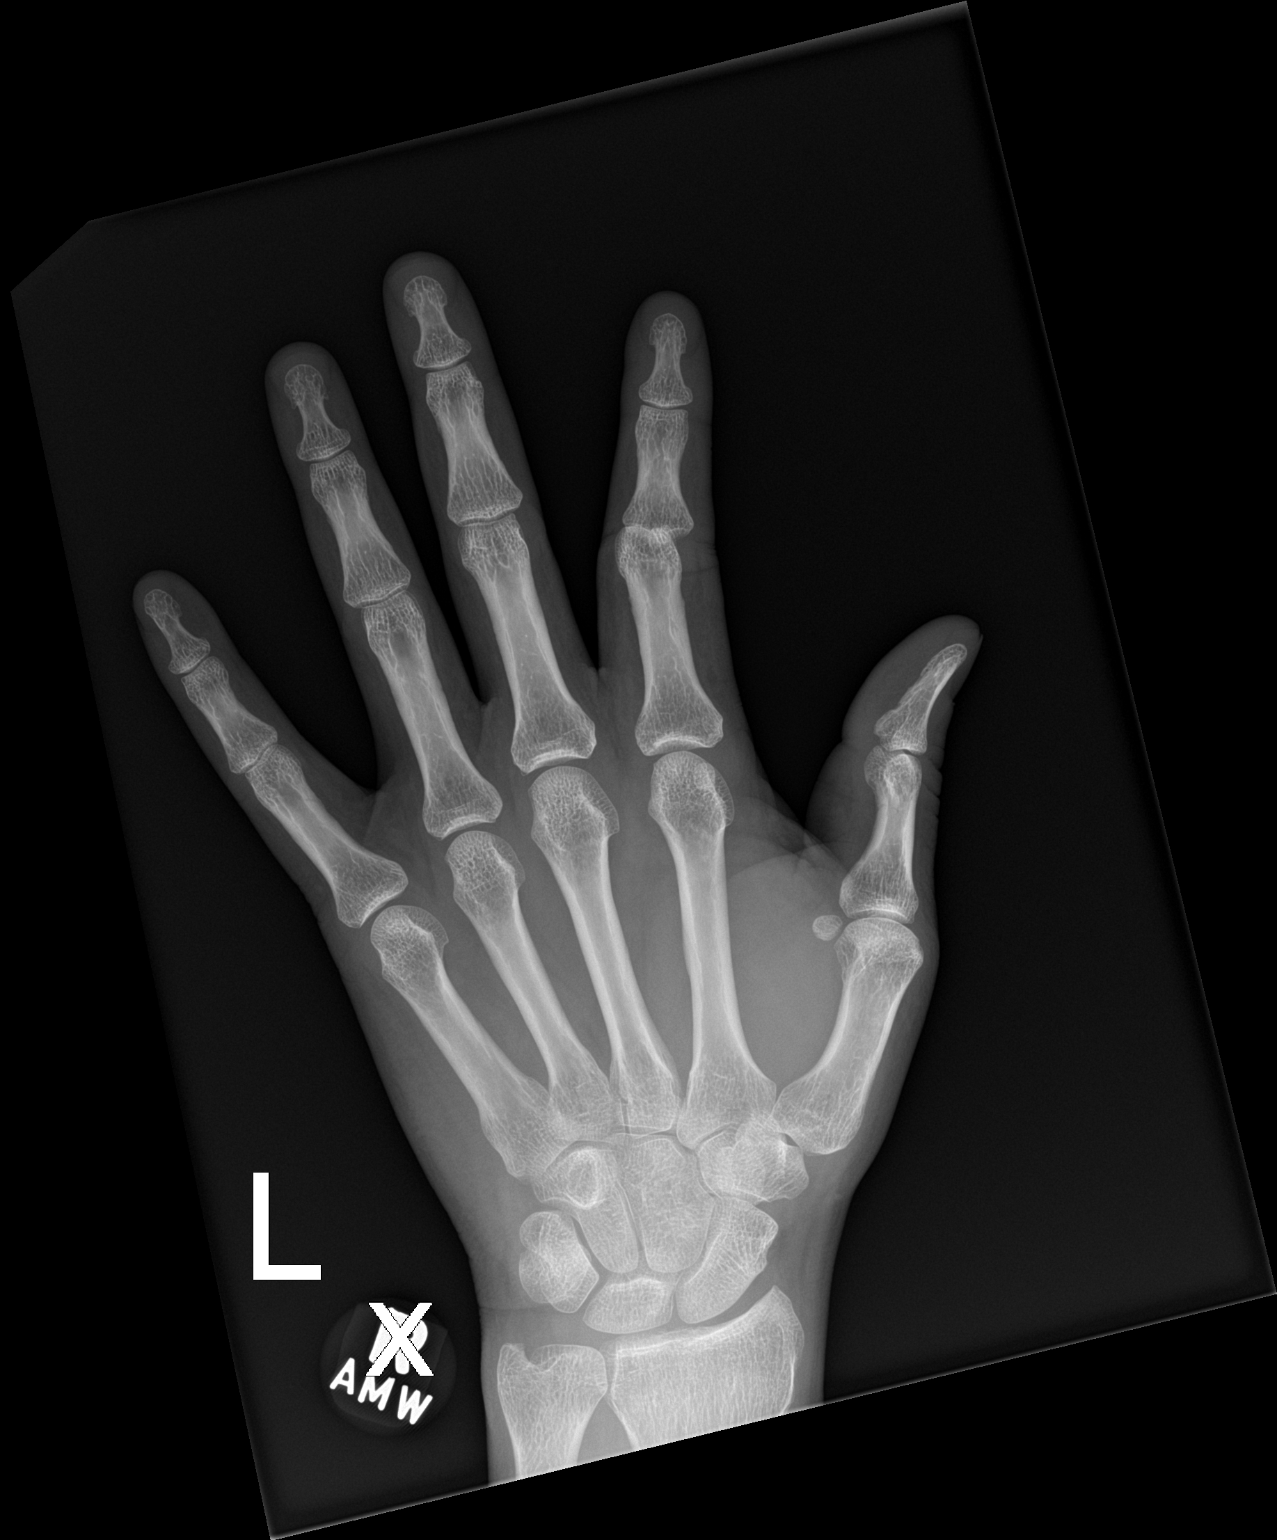

[hand obl]
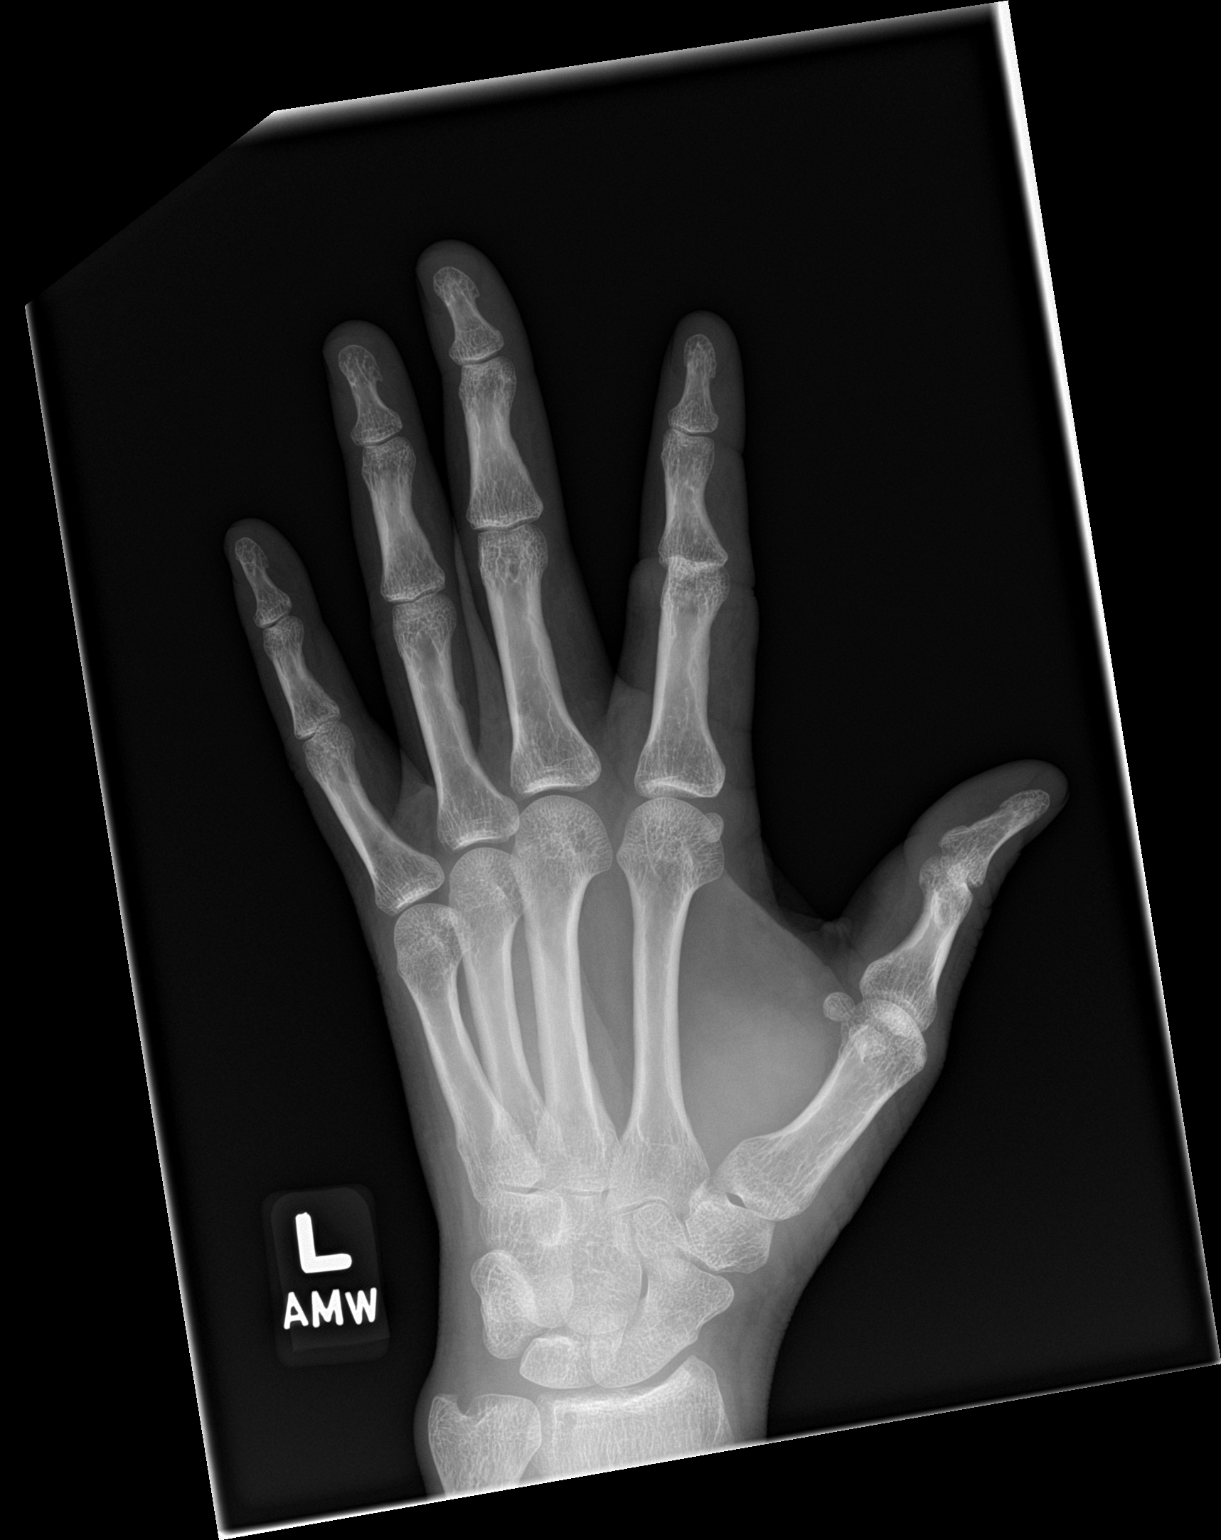

[hand lat]
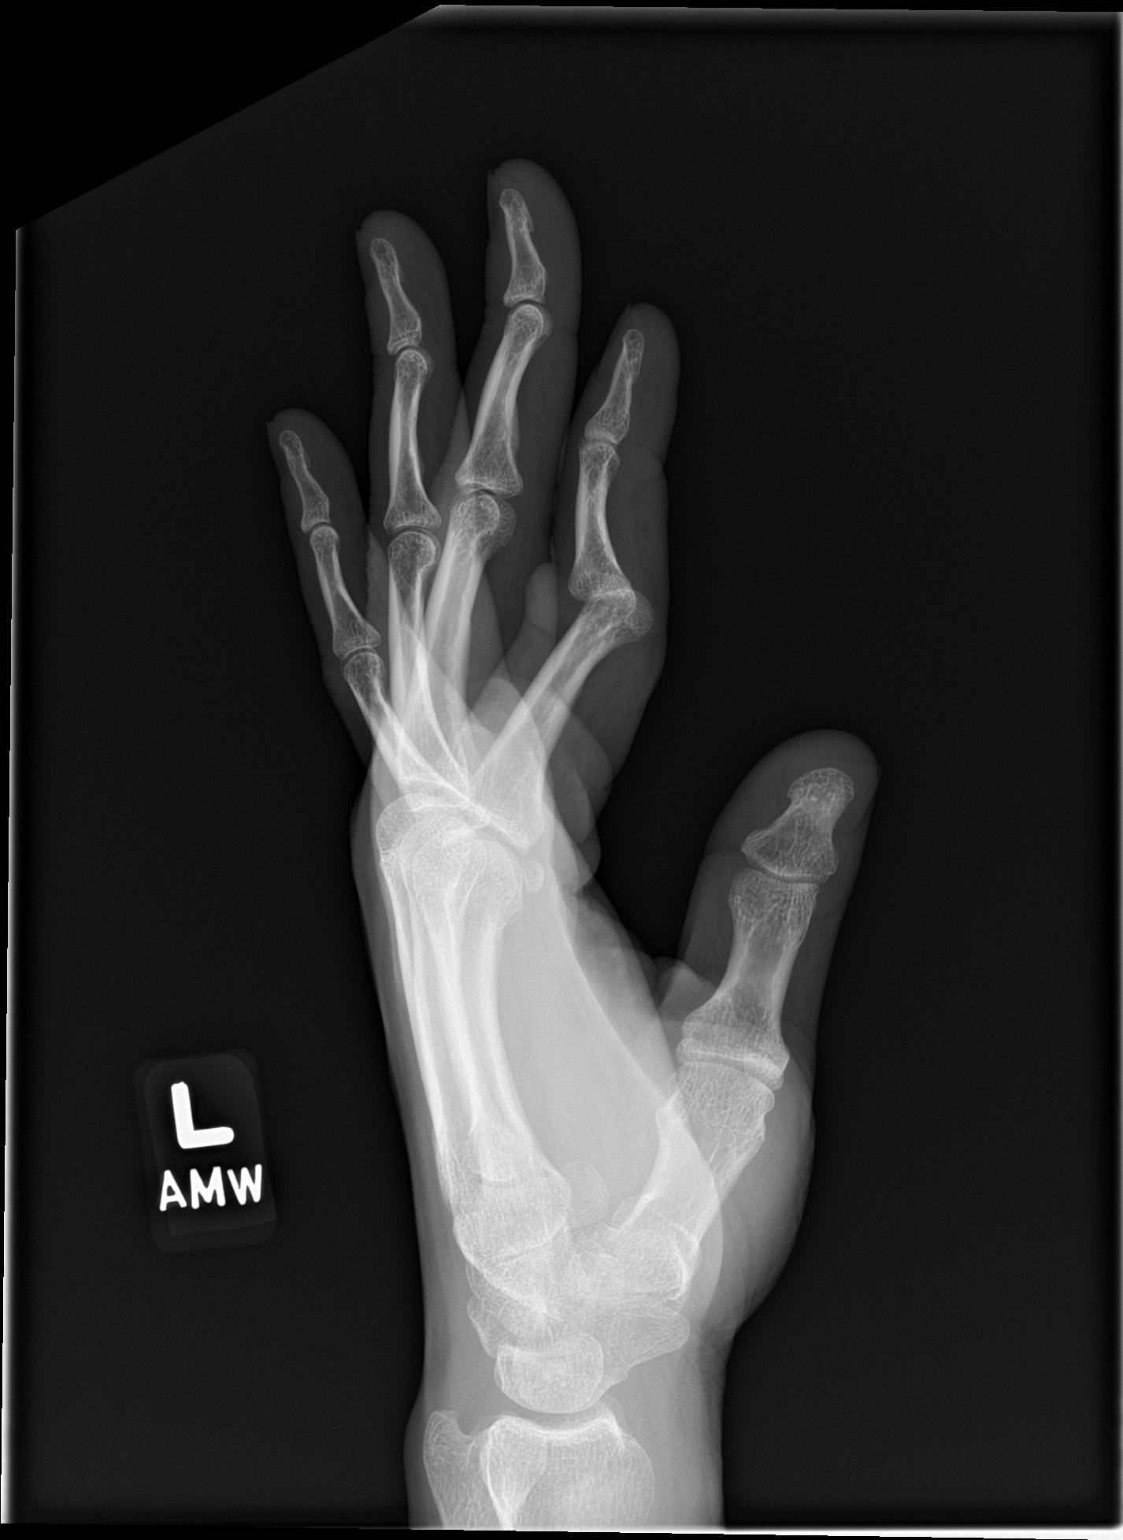

[3 of 3 positions shown; findings below may reference images not displayed]

FINDINGS: There is complete dorsal dislocation of the middle phalanx of the
left second finger with respect to the proximal phalanx. Associated
deformity and soft tissue swelling. No discrete fractures are
identified.
IMPRESSION: Dislocation of the left second proximal interphalangeal joint.

## 2024-01-09 ENCOUNTER — Encounter: Payer: Self-pay | Admitting: Oncology

## 2024-01-15 ENCOUNTER — Encounter: Payer: Self-pay | Admitting: Oncology

## 2024-01-22 ENCOUNTER — Ambulatory Visit: Attending: Physician Assistant

## 2024-01-22 DIAGNOSIS — G35 Multiple sclerosis: Secondary | ICD-10-CM | POA: Diagnosis present

## 2024-01-22 DIAGNOSIS — R2689 Other abnormalities of gait and mobility: Secondary | ICD-10-CM | POA: Insufficient documentation

## 2024-01-22 DIAGNOSIS — M6281 Muscle weakness (generalized): Secondary | ICD-10-CM | POA: Diagnosis present

## 2024-01-22 NOTE — Therapy (Signed)
 OUTPATIENT PHYSICAL THERAPY LOWER EXTREMITY EVALUATION   Patient Name: Jerry Bautista MRN: [id number] DOB:May 03, 1976, 48 y.o., male Today's Date: 01/22/2024  END OF SESSION:  PT End of Session - 01/22/24 1205     Visit Number 1    Number of Visits 13    Date for PT Re-Evaluation 03/04/24    Authorization Type Medicare PN needed at visit #89; re-cert 2x/week x 6 weeks    PT Start Time 1200    PT Stop Time 1245    PT Time Calculation (min) 45 min    Activity Tolerance Patient tolerated treatment well    Behavior During Therapy WFL for tasks assessed/performed          Past Medical History:  Diagnosis Date   Hypertension    MS (multiple sclerosis) ([facility])    No past surgical history on file. Patient Active Problem List   Diagnosis Date Noted   Hypertension 03/25/2015   Multiple sclerosis ([facility]) 02/20/2015   Gait instability 01/08/2013    PCP: Belvie Nigh, MD  REFERRING PROVIDER: Laneta Molt, PA-C  REFERRING DIAG: Multiple Sclerosis with R LE weakness  THERAPY DIAG:  Multiple sclerosis ([facility])  Balance problem  Muscle weakness (generalized)  Rationale for Evaluation and Treatment: Rehabilitation  ONSET DATE: 2000  SUBJECTIVE:   SUBJECTIVE STATEMENT: C/o R leg feels like it is dragging; catching his R foot when he walks; been having trouble for a year; sometimes has some near falls and catches himself from falling all the way to the ground.    Feels like these concerns are staying about the same overall, no major worsening past few months.  Has been dealing with MS since 2000; started as eye sx first; taking medication; has been seeking care for about a year at [facility] neurology   Has been using a SPC for ambulation if he leaves his home or if he goes out of town   Has a trip planned to [place] next month  Using stationary bike at home- 25 min some days  Aggravating factors for MS: heat, stress  PERTINENT HISTORY: Has done some PT before; has a brace  for R foot that he is not currently using  Mostly his wife does the driving, he tries to limit driving bc of his R LE right now  PAIN:  Are you having pain? No, not today  PRECAUTIONS: None  RED FLAGS: None   WEIGHT BEARING RESTRICTIONS: No  FALLS:  Has patient fallen in last 6 months? Yes. Number of falls multiple near falls/falls where he catches himself with wall/furniture  LIVING ENVIRONMENT: Lives with: lives with their family Lives in: House/apartment Stairs: No flat entry into the home; has some stairs if he visits family- has some assistance on them for balance if needed Has following equipment at home: Single point cane  OCCUPATION: retired for now; hobbies: going to [employer]  PLOF: Independent  PATIENT GOALS: pt's goal is to be able to improve his balance and walking so he does not drag his leg and does not need the [facility] all the time  NEXT MD VISIT: Early July 2025  OBJECTIVE:  Note: Objective measures were completed at Evaluation unless otherwise noted.  PATIENT SURVEYS:  ABC: 10%  COGNITION: Overall cognitive status: Within functional limits for tasks assessed     SENSATION: WFL  MUSCLE LENGTH: Hamstrings: Right SLR hip flex to 45 deg; Left SLR hip flex to 45 deg  POSTURE: rounded shoulders  PALPATION: (-) TTP LE b/l  LOWER  EXTREMITY ROM:  Passive ROM Right eval Left eval  Hip flexion 100 100  Hip extension    Hip abduction    Hip adduction    Hip internal rotation    Hip external rotation    Knee flexion    Knee extension    Ankle dorsiflexion (+) gastroc tightness (+) gastroc tightness  Ankle plantarflexion    Ankle inversion    Ankle eversion     (Blank rows = not tested)  LOWER EXTREMITY MMT:  MMT Right eval Left eval  Hip flexion 3 3+  Hip extension    Hip abduction    Hip adduction    Hip internal rotation    Hip external rotation    Knee flexion 3+ 4  Knee extension 4- 4  Ankle dorsiflexion 4 4  Ankle  plantarflexion    Ankle inversion 3+ 4  Ankle eversion 3+ 4   (Blank rows = not tested)   FUNCTIONAL TESTS:   5x STS: 31 seconds  Balance: SLS R and L- unable without UE support on counter top; with UE support x 20 seconds ea  GAIT: Distance walked: Pt amb in clinic with SPC; uses it in self selected R hand; decreased R heel strike noted during initial contact and pt lacks knee flexion during swing phase on R.                                                                                                                              TREATMENT DATE: 01/22/24  Initial evaluation performed  Self care: HEP instruction, discussed PT POC/goals  PATIENT EDUCATION:  Education details: HEP Person educated: Patient Education method: Explanation, Demonstration, and Handouts Education comprehension: verbalized understanding and returned demonstration  HOME EXERCISE PROGRAM: Access Code: [email or web address] URL: [email or web address] Date: 01/22/2024 Prepared by: Vernell Reges  Exercises - Sit to Stand Without Arm Support  - 2 x daily - 7 x weekly - 3 sets - 5 reps  ASSESSMENT:  CLINICAL IMPRESSION: Patient is a 48 y.o. M who was seen today for physical therapy evaluation and treatment for muscle weakness, difficulty with walking due to LE weakness/abnormal balance associated with his condition of multiple sclerosis.   OBJECTIVE IMPAIRMENTS: Abnormal gait, decreased balance, difficulty walking, decreased ROM, decreased strength, and impaired perceived functional ability.   ACTIVITY LIMITATIONS: standing, stairs, and locomotion level  PARTICIPATION LIMITATIONS: cleaning, driving, shopping, community activity, occupation, and yard work  PERSONAL FACTORS: Time since onset of injury/illness/exacerbation are also affecting patient's functional outcome.   REHAB POTENTIAL: Good  CLINICAL DECISION MAKING: Stable/uncomplicated  EVALUATION COMPLEXITY: Low   GOALS: Goals reviewed  with patient? Yes  [place] TERM GOALS: Target date: 02/13/24 Pt will be instructed on a HEP for LE strengthening that he can perform while traveling out of the country for vacation early July 2025 Baseline: initiated today Goal status: INITIAL    LONG TERM GOALS: Target date: 03/04/24  Improve ABC >50% indicating improved confidence with  balance and reduced fall risk Baseline: 10% Goal status: INITIAL  2.  Pt will amb without dragging R foot on flat surfaces x 6 min walk test using least restrictive or no AD and no falls Baseline: administer 6 min walk test at next visit; currently amb without sufficient DF at initial contact and reduced R knee flex/hip flex for full R foot clearance Goal status: INITIAL  3.  Improve SLS to >10 seconds R and L to facilitate improved ability to amb on flat surface without any near falls or falls during a 24 hr period of time at home Baseline: unable without use of UE x 10 sec Goal status: INITIAL   PLAN:  PT FREQUENCY: 2x/week  PT DURATION: 6 weeks  PLANNED INTERVENTIONS: 97110-Therapeutic exercises, 97530- Therapeutic activity, 97112- Neuromuscular re-education, 97535- Self Care, and [id number]- Manual therapy  PLAN FOR NEXT SESSION: assess balance further, stair navigation, begin LE balance/strengthening/gait training, work on using SPC in L UE; 6 min walk test   Vernell Reges, PT, DPT, OCS   Jalisa Sacco E Jerami Tammen, PT 01/22/2024, 5:25 PM

## 2024-01-24 ENCOUNTER — Ambulatory Visit

## 2024-01-29 ENCOUNTER — Ambulatory Visit

## 2024-01-29 DIAGNOSIS — M6281 Muscle weakness (generalized): Secondary | ICD-10-CM

## 2024-01-29 DIAGNOSIS — G35 Multiple sclerosis: Secondary | ICD-10-CM

## 2024-01-29 DIAGNOSIS — R2689 Other abnormalities of gait and mobility: Secondary | ICD-10-CM

## 2024-01-29 NOTE — Therapy (Signed)
 OUTPATIENT PHYSICAL THERAPY LOWER EXTREMITY TREATMENT   Patient Name: Jerry Bautista MRN: [id number] DOB:1975/12/26, 48 y.o., male Today's Date: 01/29/2024  END OF SESSION:  PT End of Session - 01/29/24 1300     Visit Number 2    Number of Visits 13    Date for PT Re-Evaluation 03/04/24    Authorization Type Medicare PN needed at visit #89; re-cert 2x/week x 6 weeks    PT Start Time 1245    PT Stop Time 1330    PT Time Calculation (min) 45 min    Activity Tolerance Patient tolerated treatment well    Behavior During Therapy WFL for tasks assessed/performed           Past Medical History:  Diagnosis Date   Hypertension    MS (multiple sclerosis) (HCC)    No past surgical history on file. Patient Active Problem List   Diagnosis Date Noted   Hypertension 03/25/2015   Multiple sclerosis (HCC) 02/20/2015   Gait instability 01/08/2013    PCP: Belvie Nigh, MD  REFERRING PROVIDER: Laneta Molt, PA-C  REFERRING DIAG: Multiple Sclerosis with R LE weakness  THERAPY DIAG:  Multiple sclerosis (HCC)  Balance problem  Muscle weakness (generalized)  Rationale for Evaluation and Treatment: Rehabilitation  ONSET DATE: 2000  SUBJECTIVE:   SUBJECTIVE STATEMENT: C/o R leg feels like it is dragging; catching his R foot when he walks; been having trouble for a year; sometimes has some near falls and catches himself from falling all the way to the ground.    Feels like these concerns are staying about the same overall, no major worsening past few months.  Has been dealing with MS since 2000; started as eye sx first; taking medication; has been seeking care for about a year at [facility] neurology   Has been using a SPC for ambulation if he leaves his home or if he goes out of town   Has a trip planned to [place] next month  Using stationary bike at home- 25 min some days  Aggravating factors for MS: heat, stress  PERTINENT HISTORY: Has done some PT before; has a brace  for R foot that he is not currently using  Mostly his wife does the driving, he tries to limit driving bc of his R LE right now  PAIN:  Are you having pain? No, not today  PRECAUTIONS: None  RED FLAGS: None   WEIGHT BEARING RESTRICTIONS: No  FALLS:  Has patient fallen in last 6 months? Yes. Number of falls multiple near falls/falls where he catches himself with wall/furniture  LIVING ENVIRONMENT: Lives with: lives with their family Lives in: House/apartment Stairs: No flat entry into the home; has some stairs if he visits family- has some assistance on them for balance if needed Has following equipment at home: Single point cane  OCCUPATION: retired for now; hobbies: going to [employer]  PLOF: Independent  PATIENT GOALS: pt's goal is to be able to improve his balance and walking so he does not drag his leg and does not need the [facility] all the time  NEXT MD VISIT: Early July 2025  OBJECTIVE:  Note: Objective measures were completed at Evaluation unless otherwise noted.  PATIENT SURVEYS:  ABC: 10%  COGNITION: Overall cognitive status: Within functional limits for tasks assessed     SENSATION: WFL  MUSCLE LENGTH: Hamstrings: Right SLR hip flex to 45 deg; Left SLR hip flex to 45 deg  POSTURE: rounded shoulders  PALPATION: (-) TTP LE b/l  LOWER EXTREMITY ROM:  Passive ROM Right eval Left eval  Hip flexion 100 100  Hip extension    Hip abduction    Hip adduction    Hip internal rotation    Hip external rotation    Knee flexion    Knee extension    Ankle dorsiflexion (+) gastroc tightness (+) gastroc tightness  Ankle plantarflexion    Ankle inversion    Ankle eversion     (Blank rows = not tested)  LOWER EXTREMITY MMT:  MMT Right eval Left eval  Hip flexion 3 3+  Hip extension    Hip abduction    Hip adduction    Hip internal rotation    Hip external rotation    Knee flexion 3+ 4  Knee extension 4- 4  Ankle dorsiflexion 4 4  Ankle  plantarflexion    Ankle inversion 3+ 4  Ankle eversion 3+ 4   (Blank rows = not tested)   FUNCTIONAL TESTS:   5x STS: 31 seconds  Balance: SLS R and L- unable without UE support on counter top; with UE support x 20 seconds ea  GAIT: Distance walked: Pt amb in clinic with SPC; uses it in self selected R hand; decreased R heel strike noted during initial contact and pt lacks knee flexion during swing phase on R.                                                                                                                              TREATMENT DATE: 01/22/24  Subjective: pt cancelled last session due to excessive summer heat in [place] which made it difficult for his MS to tolerate any exercise.  He continues to amb with SPC.  Has trip planned to [place] in a few weeks and hopes to be able to walk and enjoy vacation while he is there, maybe even without his SPC some.  No major falls.  Continues to drag R foot at times while walking.  Pain: none today  Objective:  Therapeutic Exercise: Nustep x level 3 x 8 min Seated knee extension: 5# 2x12 ea Standing hamstring culr: 5# 2x15 ea Standing heel raises: 2x12  Therapeutic Activities: Sit to stand: 5x, 3 sets focusing on no UE use (1 airex on chair) Standing hurdle navigation: emphasizing hip flexion and DF strategy for foot clearance; first 6 inch hurdles x 5 laps in parallel bars, then added 12 inch height x 4 laps Discussed activity pacing for airport travel and using golf cart as option to transport from gate to gate to preserve energy during travel Offered cold pack during session for thermoregulation, pt declined today.  HEP instruction see below  PATIENT EDUCATION:  Education details: HEP, PT POC/goals Person educated: Patient Education method: Explanation, Demonstration, and Handouts Education comprehension: verbalized understanding and returned demonstration  HOME EXERCISE PROGRAM: Access Code: [email or web address] URL:  [email or web address] Date: 01/22/2024 Prepared by: Vernell Reges  Exercises - Sit to Stand Without Arm Support  -  2 x daily - 7 x weekly - 3 sets - 5 reps  ASSESSMENT:  CLINICAL IMPRESSION: Patient tolerated first tx visit well today; reports RPE of 4-6 throughout session.  Declined use of cold pack during session for thermoregulation.  No major losses of balance, he does lack LE proprioception during LE strengthening; use of PT hand for visual target improved technique for knee extension and hamstring curls.  Overall should continue to benefit from skilled PT to address impairments below and to address difficulty with walking due to LE weakness/abnormal balance associated with his condition of multiple sclerosis.   OBJECTIVE IMPAIRMENTS: Abnormal gait, decreased balance, difficulty walking, decreased ROM, decreased strength, and impaired perceived functional ability.   ACTIVITY LIMITATIONS: standing, stairs, and locomotion level  PARTICIPATION LIMITATIONS: cleaning, driving, shopping, community activity, occupation, and yard work  PERSONAL FACTORS: Time since onset of injury/illness/exacerbation are also affecting patient's functional outcome.   REHAB POTENTIAL: Good  CLINICAL DECISION MAKING: Stable/uncomplicated  EVALUATION COMPLEXITY: Low   GOALS: Goals reviewed with patient? Yes  [place] TERM GOALS: Target date: 02/13/24 Pt will be instructed on a HEP for LE strengthening that he can perform while traveling out of the country for vacation early July 2025 Baseline: initiated today Goal status: INITIAL    LONG TERM GOALS: Target date: 03/04/24  Improve ABC >50% indicating improved confidence with balance and reduced fall risk Baseline: 10% Goal status: INITIAL  2.  Pt will amb without dragging R foot on flat surfaces x 6 min walk test using least restrictive or no AD and no falls Baseline: administer 6 min walk test at next visit; currently amb without  sufficient DF at initial contact and reduced R knee flex/hip flex for full R foot clearance Goal status: INITIAL  3.  Improve SLS to >10 seconds R and L to facilitate improved ability to amb on flat surface without any near falls or falls during a 24 hr period of time at home Baseline: unable without use of UE x 10 sec Goal status: INITIAL   PLAN:  PT FREQUENCY: 2x/week  PT DURATION: 6 weeks  PLANNED INTERVENTIONS: 97110-Therapeutic exercises, 97530- Therapeutic activity, 97112- Neuromuscular re-education, 97535- Self Care, and 02859- Manual therapy  PLAN FOR NEXT SESSION: assess balance further, stair navigation, begin LE balance/strengthening/gait training, work on using SPC in L UE; 6 min walk test   Vernell Reges, PT, DPT, OCS   Nanda Bittick E Delissa Silba, PT 01/29/2024, 6:21 PM

## 2024-02-05 ENCOUNTER — Encounter

## 2024-02-06 NOTE — Therapy (Incomplete)
 OUTPATIENT PHYSICAL THERAPY LOWER EXTREMITY TREATMENT   Patient Name: Jerry Bautista MRN: [id number] DOB:November 10, 1975, 48 y.o., male Today's Date: 02/06/2024  END OF SESSION:     Past Medical History:  Diagnosis Date   Hypertension    MS (multiple sclerosis) (HCC)    No past surgical history on file. Patient Active Problem List   Diagnosis Date Noted   Hypertension 03/25/2015   Multiple sclerosis (HCC) 02/20/2015   Gait instability 01/08/2013    PCP: Belvie Nigh, MD  REFERRING PROVIDER: Laneta Molt, PA-C  REFERRING DIAG: Multiple Sclerosis with R LE weakness  THERAPY DIAG:  Multiple sclerosis (HCC)  Balance problem  Muscle weakness (generalized)  Rationale for Evaluation and Treatment: Rehabilitation  ONSET DATE: 2000  SUBJECTIVE:   SUBJECTIVE STATEMENT: C/o R leg feels like it is dragging; catching his R foot when he walks; been having trouble for a year; sometimes has some near falls and catches himself from falling all the way to the ground.    Feels like these concerns are staying about the same overall, no major worsening past few months.  Has been dealing with MS since 2000; started as eye sx first; taking medication; has been seeking care for about a year at [facility] neurology   Has been using a SPC for ambulation if he leaves his home or if he goes out of town   Has a trip planned to [place] next month  Using stationary bike at home- 25 min some days  Aggravating factors for MS: heat, stress  PERTINENT HISTORY: Has done some PT before; has a brace for R foot that he is not currently using  Mostly his wife does the driving, he tries to limit driving bc of his R LE right now  PAIN:  Are you having pain? No, not today  PRECAUTIONS: None  RED FLAGS: None   WEIGHT BEARING RESTRICTIONS: No  FALLS:  Has patient fallen in last 6 months? Yes. Number of falls multiple near falls/falls where he catches himself with wall/furniture  LIVING  ENVIRONMENT: Lives with: lives with their family Lives in: House/apartment Stairs: No flat entry into the home; has some stairs if he visits family- has some assistance on them for balance if needed Has following equipment at home: Single point cane  OCCUPATION: retired for now; hobbies: going to [employer]  PLOF: Independent  PATIENT GOALS: pt's goal is to be able to improve his balance and walking so he does not drag his leg and does not need the [facility] all the time  NEXT MD VISIT: Early July 2025  OBJECTIVE:  Note: Objective measures were completed at Evaluation unless otherwise noted.  PATIENT SURVEYS:  ABC: 10%  COGNITION: Overall cognitive status: Within functional limits for tasks assessed     SENSATION: WFL  MUSCLE LENGTH: Hamstrings: Right SLR hip flex to 45 deg; Left SLR hip flex to 45 deg  POSTURE: rounded shoulders  PALPATION: (-) TTP LE b/l  LOWER EXTREMITY ROM:  Passive ROM Right eval Left eval  Hip flexion 100 100  Hip extension    Hip abduction    Hip adduction    Hip internal rotation    Hip external rotation    Knee flexion    Knee extension    Ankle dorsiflexion (+) gastroc tightness (+) gastroc tightness  Ankle plantarflexion    Ankle inversion    Ankle eversion     (Blank rows = not tested)  LOWER EXTREMITY MMT:  MMT Right eval Left eval  Hip flexion 3 3+  Hip extension    Hip abduction    Hip adduction    Hip internal rotation    Hip external rotation    Knee flexion 3+ 4  Knee extension 4- 4  Ankle dorsiflexion 4 4  Ankle plantarflexion    Ankle inversion 3+ 4  Ankle eversion 3+ 4   (Blank rows = not tested)   FUNCTIONAL TESTS:   5x STS: 31 seconds  Balance: SLS R and L- unable without UE support on counter top; with UE support x 20 seconds ea  GAIT: Distance walked: Pt amb in clinic with SPC; uses it in self selected R hand; decreased R heel strike noted during initial contact and pt lacks knee flexion during swing  phase on R.                                                                                                                              TREATMENT DATE: 02/06/24 ***   Subjective: pt cancelled last session due to excessive summer heat in  which made it difficult for his MS to tolerate any exercise.  He continues to amb with SPC.  Has trip planned to [place] in a few weeks and hopes to be able to walk and enjoy vacation while he is there, maybe even without his SPC some.  No major falls.  Continues to drag R foot at times while walking.  Pain: none today  Objective: : ***   Therapeutic Exercise: Nustep x level 3 x 8 min Seated knee extension: 5# 2x12 ea Standing hamstring culr: 5# 2x15 ea Standing heel raises: 2x12  Therapeutic Activities: Sit to stand: 5x, 3 sets focusing on no UE use (1 airex on chair) Standing hurdle navigation: emphasizing hip flexion and DF strategy for foot clearance; first 6 inch hurdles x 5 laps in parallel bars, then added 12 inch height x 4 laps Discussed activity pacing for airport travel and using golf cart as option to transport from gate to gate to preserve energy during travel Offered cold pack during session for thermoregulation, pt declined today.  HEP instruction see below  PATIENT EDUCATION:  Education details: HEP, PT POC/goals Person educated: Patient Education method: Explanation, Demonstration, and Handouts Education comprehension: verbalized understanding and returned demonstration  HOME EXERCISE PROGRAM: Access Code: [email or web address] URL: [email or web address] Date: 01/22/2024 Prepared by: Vernell Reges  Exercises - Sit to Stand Without Arm Support  - 2 x daily - 7 x weekly - 3 sets - 5 reps  ASSESSMENT:  CLINICAL IMPRESSION: Patient tolerated first tx visit well today; reports RPE of 4-6 throughout session.  Declined use of cold pack during session for thermoregulation.  No major losses of balance, he does lack LE  proprioception during LE strengthening; use of PT hand for visual target improved technique for knee extension and hamstring curls.  Overall should continue to benefit from skilled PT to address impairments below and to address difficulty with  walking due to LE weakness/abnormal balance associated with his condition of multiple sclerosis.   OBJECTIVE IMPAIRMENTS: Abnormal gait, decreased balance, difficulty walking, decreased ROM, decreased strength, and impaired perceived functional ability.   ACTIVITY LIMITATIONS: standing, stairs, and locomotion level  PARTICIPATION LIMITATIONS: cleaning, driving, shopping, community activity, occupation, and yard work  PERSONAL FACTORS: Time since onset of injury/illness/exacerbation are also affecting patient's functional outcome.   REHAB POTENTIAL: Good  CLINICAL DECISION MAKING: Stable/uncomplicated  EVALUATION COMPLEXITY: Low   GOALS: Goals reviewed with patient? Yes  [place] TERM GOALS: Target date: 02/13/24 Pt will be instructed on a HEP for LE strengthening that he can perform while traveling out of the country for vacation early July 2025 Baseline: initiated today Goal status: INITIAL    LONG TERM GOALS: Target date: 03/04/24  Improve ABC >50% indicating improved confidence with balance and reduced fall risk Baseline: 10% Goal status: INITIAL  2.  Pt will amb without dragging R foot on flat surfaces x 6 min walk test using least restrictive or no AD and no falls Baseline: administer 6 min walk test at next visit; currently amb without sufficient DF at initial contact and reduced R knee flex/hip flex for full R foot clearance Goal status: INITIAL  3.  Improve SLS to >10 seconds R and L to facilitate improved ability to amb on flat surface without any near falls or falls during a 24 hr period of time at home Baseline: unable without use of UE x 10 sec Goal status: INITIAL   PLAN:  PT FREQUENCY: 2x/week  PT DURATION: 6  weeks  PLANNED INTERVENTIONS: 97110-Therapeutic exercises, 97530- Therapeutic activity, 97112- Neuromuscular re-education, 97535- Self Care, and 02859- Manual therapy  PLAN FOR NEXT SESSION: assess balance further, stair navigation, begin LE balance/strengthening/gait training, work on using SPC in L UE; 6 min walk test   Maryanne Finder, PT, DPT Physical Therapist - [facility]  [facility]  Maryanne DELENA Finder, PT 02/06/2024, 3:39 PM

## 2024-02-07 ENCOUNTER — Ambulatory Visit

## 2024-02-07 DIAGNOSIS — R2689 Other abnormalities of gait and mobility: Secondary | ICD-10-CM

## 2024-02-07 DIAGNOSIS — G35 Multiple sclerosis: Secondary | ICD-10-CM

## 2024-02-07 DIAGNOSIS — M6281 Muscle weakness (generalized): Secondary | ICD-10-CM

## 2024-02-12 ENCOUNTER — Ambulatory Visit: Attending: Physician Assistant

## 2024-02-12 DIAGNOSIS — G35 Multiple sclerosis: Secondary | ICD-10-CM | POA: Diagnosis present

## 2024-02-12 DIAGNOSIS — M6281 Muscle weakness (generalized): Secondary | ICD-10-CM | POA: Diagnosis present

## 2024-02-12 DIAGNOSIS — R2689 Other abnormalities of gait and mobility: Secondary | ICD-10-CM | POA: Diagnosis present

## 2024-02-12 NOTE — Therapy (Signed)
 OUTPATIENT PHYSICAL THERAPY LOWER EXTREMITY TREATMENT   Patient Name: Jerry Bautista MRN: [id number] DOB:[birth year], 48 y.o., male Today's Date: 02/12/2024  END OF SESSION:  PT End of Session - 02/12/24 1212     Visit Number 3    Number of Visits 13    Date for PT Re-Evaluation 03/04/24    Authorization Type Medicare PN needed at visit #89; re-cert 2x/week x 6 weeks    PT Start Time 1205    PT Stop Time 1245    PT Time Calculation (min) 40 min    Activity Tolerance Patient tolerated treatment well    Behavior During Therapy WFL for tasks assessed/performed           Past Medical History:  Diagnosis Date   Hypertension    MS (multiple sclerosis) (HCC)    No past surgical history on file. Patient Active Problem List   Diagnosis Date Noted   Hypertension 03/25/2015   Multiple sclerosis (HCC) 02/20/2015   Gait instability 01/08/2013    PCP: Belvie Nigh, MD  REFERRING PROVIDER: Laneta Molt, PA-C  REFERRING DIAG: Multiple Sclerosis with R LE weakness  THERAPY DIAG:  Multiple sclerosis (HCC)  Balance problem  Muscle weakness (generalized)  Rationale for Evaluation and Treatment: Rehabilitation  ONSET DATE: 2000  SUBJECTIVE:   SUBJECTIVE STATEMENT: C/o R leg feels like it is dragging; catching his R foot when he walks; been having trouble for a year; sometimes has some near falls and catches himself from falling all the way to the ground.    Feels like these concerns are staying about the same overall, no major worsening past few months.  Has been dealing with MS since 2000; started as eye sx first; taking medication; has been seeking care for about a year at [facility] neurology   Has been using a SPC for ambulation if he leaves his home or if he goes out of town   Has a trip planned to [place] next month  Using stationary bike at home- 25 min some days  Aggravating factors for MS: heat, stress  PERTINENT HISTORY: Has done some PT before; has a brace  for R foot that he is not currently using  Mostly his wife does the driving, he tries to limit driving bc of his R LE right now  PAIN:  Are you having pain? No, not today  PRECAUTIONS: None  RED FLAGS: None   WEIGHT BEARING RESTRICTIONS: No  FALLS:  Has patient fallen in last 6 months? Yes. Number of falls multiple near falls/falls where he catches himself with wall/furniture  LIVING ENVIRONMENT: Lives with: lives with their family Lives in: House/apartment Stairs: No flat entry into the home; has some stairs if he visits family- has some assistance on them for balance if needed Has following equipment at home: Single point cane  OCCUPATION: retired for now; hobbies: going to [employer]  PLOF: Independent  PATIENT GOALS: pt's goal is to be able to improve his balance and walking so he does not drag his leg and does not need the [facility] all the time  NEXT MD VISIT: Early July 2025  OBJECTIVE:  Note: Objective measures were completed at Evaluation unless otherwise noted.  PATIENT SURVEYS:  ABC: 10%  COGNITION: Overall cognitive status: Within functional limits for tasks assessed     SENSATION: WFL  MUSCLE LENGTH: Hamstrings: Right SLR hip flex to 45 deg; Left SLR hip flex to 45 deg  POSTURE: rounded shoulders  PALPATION: (-) TTP LE b/l  LOWER EXTREMITY ROM:  Passive ROM Right eval Left eval  Hip flexion 100 100  Hip extension    Hip abduction    Hip adduction    Hip internal rotation    Hip external rotation    Knee flexion    Knee extension    Ankle dorsiflexion (+) gastroc tightness (+) gastroc tightness  Ankle plantarflexion    Ankle inversion    Ankle eversion     (Blank rows = not tested)  LOWER EXTREMITY MMT:  MMT Right eval Left eval  Hip flexion 3 3+  Hip extension    Hip abduction    Hip adduction    Hip internal rotation    Hip external rotation    Knee flexion 3+ 4  Knee extension 4- 4  Ankle dorsiflexion 4 4  Ankle  plantarflexion    Ankle inversion 3+ 4  Ankle eversion 3+ 4   (Blank rows = not tested)   FUNCTIONAL TESTS:   5x STS: 31 seconds  Balance: SLS R and L- unable without UE support on counter top; with UE support x 20 seconds ea  GAIT: Distance walked: Pt amb in clinic with SPC; uses it in self selected R hand; decreased R heel strike noted during initial contact and pt lacks knee flexion during swing phase on R.                                                                                                                              TREATMENT DATE: 02/12/24  Subjective: pt reports working on his HEP; no falls.  Getting ready for a trip to [place] later this week with his wife.  Doing a little walking without his cane.  Pain: none today  Objective:  Therapeutic Exercise: Nustep x level 3 x 8 min- not today Seated knee extension: 5# 2x12 ea Standing hamstring culr: 5# 2x15 ea Standing heel raises: 2x12 Seated marches: 2x12 ea 5#   Therapeutic Activities: Sit to stand: 5x, 3 sets focusing on no UE use from high mat table Standing hurdle navigation: emphasizing hip flexion and DF strategy for foot clearance; 6 inch height x 4 laps Discussed activity pacing for airport travel and using golf cart as option to transport from gate to gate to preserve energy during travel Offered cold pack during session for thermoregulation, pt declined today.  Neuro re-ed:  SLS on airex x 5x ea LE, with UE support (unilateral) Pt education for how to adapt his SPC with an attachment for sand to improve safety/reduce fall risk while amb on sand at the beach later this week  HEP instruction see below  PATIENT EDUCATION:  Education details: HEP, PT POC/goals Person educated: Patient Education method: Explanation, Demonstration, and Handouts Education comprehension: verbalized understanding and returned demonstration  HOME EXERCISE PROGRAM: Access Code: [email or web address] URL:  [email or web address] Date: 01/22/2024 Prepared by: Vernell Reges  Exercises - Sit to Stand Without Arm Support  - 2 x daily -  7 x weekly - 3 sets - 5 reps  ASSESSMENT:  CLINICAL IMPRESSION: Patient was challenged appropriately with LE exercises.  Had 2 small trips over the hurdle due to impaired LE proprioception and trying to step from too far behind the hurdle.  No falls, PT close supervision and pt used railing.  He does lack LE proprioception during LE strengthening; use of PT hand for visual target improved technique for knee extension and hamstring curls.  Overall should continue to benefit from skilled PT to address impairments below and to address difficulty with walking due to LE weakness/abnormal balance associated with his condition of multiple sclerosis.   OBJECTIVE IMPAIRMENTS: Abnormal gait, decreased balance, difficulty walking, decreased ROM, decreased strength, and impaired perceived functional ability.   ACTIVITY LIMITATIONS: standing, stairs, and locomotion level  PARTICIPATION LIMITATIONS: cleaning, driving, shopping, community activity, occupation, and yard work  PERSONAL FACTORS: Time since onset of injury/illness/exacerbation are also affecting patient's functional outcome.   REHAB POTENTIAL: Good  CLINICAL DECISION MAKING: Stable/uncomplicated  EVALUATION COMPLEXITY: Low   GOALS: Goals reviewed with patient? Yes  [place] TERM GOALS: Target date: 02/13/24 Pt will be instructed on a HEP for LE strengthening that he can perform while traveling out of the country for vacation early July 2025 Baseline: initiated today Goal status: INITIAL    LONG TERM GOALS: Target date: 03/04/24  Improve ABC >50% indicating improved confidence with balance and reduced fall risk Baseline: 10% Goal status: INITIAL  2.  Pt will amb without dragging R foot on flat surfaces x 6 min walk test using least restrictive or no AD and no falls Baseline: administer 6 min  walk test at next visit; currently amb without sufficient DF at initial contact and reduced R knee flex/hip flex for full R foot clearance Goal status: INITIAL  3.  Improve SLS to >10 seconds R and L to facilitate improved ability to amb on flat surface without any near falls or falls during a 24 hr period of time at home Baseline: unable without use of UE x 10 sec Goal status: INITIAL   PLAN:  PT FREQUENCY: 2x/week  PT DURATION: 6 weeks  PLANNED INTERVENTIONS: 97110-Therapeutic exercises, 97530- Therapeutic activity, 97112- Neuromuscular re-education, 97535- Self Care, and 02859- Manual therapy  PLAN FOR NEXT SESSION: assess balance further, stair navigation, begin LE balance/strengthening/gait training, work on using SPC in L UE; 6 min walk test  Vernell Reges, PT, DPT, OCS   Ladarius Seubert E [place] Nies, PT 02/12/2024, 12:22 PM

## 2024-02-14 ENCOUNTER — Ambulatory Visit

## 2024-02-19 ENCOUNTER — Ambulatory Visit

## 2024-02-21 ENCOUNTER — Encounter

## 2024-02-26 ENCOUNTER — Ambulatory Visit

## 2024-02-28 ENCOUNTER — Ambulatory Visit

## 2024-02-28 DIAGNOSIS — R2689 Other abnormalities of gait and mobility: Secondary | ICD-10-CM

## 2024-02-28 DIAGNOSIS — G35 Multiple sclerosis: Secondary | ICD-10-CM

## 2024-02-28 DIAGNOSIS — M6281 Muscle weakness (generalized): Secondary | ICD-10-CM

## 2024-02-28 NOTE — Therapy (Signed)
 OUTPATIENT PHYSICAL THERAPY LOWER EXTREMITY TREATMENT   Patient Name: Jerry Bautista MRN: [id number] DOB:Dec 23, 1975, 48 y.o., male Today's Date: 02/29/2024  END OF SESSION:  PT End of Session - 02/29/24 2124     Visit Number 4    Number of Visits 13    Date for PT Re-Evaluation 03/04/24    Authorization Type Medicare PN needed at visit #89; re-cert 2x/week x 6 weeks    PT Start Time 1200    PT Stop Time 1245    PT Time Calculation (min) 45 min    Activity Tolerance Patient tolerated treatment well    Behavior During Therapy WFL for tasks assessed/performed            Past Medical History:  Diagnosis Date   Hypertension    MS (multiple sclerosis) (HCC)    No past surgical history on file. Patient Active Problem List   Diagnosis Date Noted   Hypertension 03/25/2015   Multiple sclerosis (HCC) 02/20/2015   Gait instability 01/08/2013    PCP: Belvie Nigh, MD  REFERRING PROVIDER: Laneta Molt, PA-C  REFERRING DIAG: Multiple Sclerosis with R LE weakness  THERAPY DIAG:  Multiple sclerosis (HCC)  Balance problem  Muscle weakness (generalized)  Rationale for Evaluation and Treatment: Rehabilitation  ONSET DATE: 2000  SUBJECTIVE:   SUBJECTIVE STATEMENT: C/o R leg feels like it is dragging; catching his R foot when he walks; been having trouble for a year; sometimes has some near falls and catches himself from falling all the way to the ground.    Feels like these concerns are staying about the same overall, no major worsening past few months.  Has been dealing with MS since 2000; started as eye sx first; taking medication; has been seeking care for about a year at [facility] neurology   Has been using a SPC for ambulation if he leaves his home or if he goes out of town   Has a trip planned to [place] next month  Using stationary bike at home- 25 min some days  Aggravating factors for MS: heat, stress  PERTINENT HISTORY: Has done some PT before; has a  brace for R foot that he is not currently using  Mostly his wife does the driving, he tries to limit driving bc of his R LE right now  PAIN:  Are you having pain? No, not today  PRECAUTIONS: None  RED FLAGS: None   WEIGHT BEARING RESTRICTIONS: No  FALLS:  Has patient fallen in last 6 months? Yes. Number of falls multiple near falls/falls where he catches himself with wall/furniture  LIVING ENVIRONMENT: Lives with: lives with their family Lives in: House/apartment Stairs: No flat entry into the home; has some stairs if he visits family- has some assistance on them for balance if needed Has following equipment at home: Single point cane  OCCUPATION: retired for now; hobbies: going to [employer]  PLOF: Independent  PATIENT GOALS: pt's goal is to be able to improve his balance and walking so he does not drag his leg and does not need the [facility] all the time  NEXT MD VISIT: Early July 2025  OBJECTIVE:  Note: Objective measures were completed at Evaluation unless otherwise noted.  PATIENT SURVEYS:  ABC: 10%  COGNITION: Overall cognitive status: Within functional limits for tasks assessed     SENSATION: WFL  MUSCLE LENGTH: Hamstrings: Right SLR hip flex to 45 deg; Left SLR hip flex to 45 deg  POSTURE: rounded shoulders  PALPATION: (-) TTP LE b/l  LOWER EXTREMITY ROM:  Passive ROM Right eval Left eval  Hip flexion 100 100  Hip extension    Hip abduction    Hip adduction    Hip internal rotation    Hip external rotation    Knee flexion    Knee extension    Ankle dorsiflexion (+) gastroc tightness (+) gastroc tightness  Ankle plantarflexion    Ankle inversion    Ankle eversion     (Blank rows = not tested)  LOWER EXTREMITY MMT:  MMT Right eval Left eval  Hip flexion 3 3+  Hip extension    Hip abduction    Hip adduction    Hip internal rotation    Hip external rotation    Knee flexion 3+ 4  Knee extension 4- 4  Ankle dorsiflexion 4 4  Ankle  plantarflexion    Ankle inversion 3+ 4  Ankle eversion 3+ 4   (Blank rows = not tested)   FUNCTIONAL TESTS:   5x STS: 31 seconds  Balance: SLS R and L- unable without UE support on counter top; with UE support x 20 seconds ea  GAIT: Distance walked: Pt amb in clinic with SPC; uses it in self selected R hand; decreased R heel strike noted during initial contact and pt lacks knee flexion during swing phase on R.                                                                                                                              TREATMENT DATE: 02/28/24  Subjective: pt reports working on his HEP; no falls.  Just returned home from a trip to [place].  Didn't end up walking on the beach with his cane.  Got assistance with a cart to ride through the airports.  He is wondering about which hand to use the cane in.  Dragging R foot some still.    Pain: none today  Objective:  Therapeutic Exercise: Nustep x level 3 x 8 min- not today Seated knee extension: 5# 2x12 ea Standing hamstring culr: 5# 2x15 ea Standing heel raises: 2x12 Seated marches: 2x12 ea 5#   Therapeutic Activities: Sit to stand: 5x, 3 sets focusing on no UE use from high mat table Standing hurdle navigation: emphasizing hip flexion and DF strategy for foot clearance; 6 inch height x 4 laps; then added 2 12-inch hurdles for 3 laps; then practiced lateral step overs 3 laps  Neuro re-ed:  SLS on airex x 5x ea LE, with UE support (unilateral) Pt education for how to amb with SPC in L UE to facilitate optimal gait mechanics; assessed cane height too; practiced amb in hallway with intermittent verbal cues for sequencing- 5 min  HEP instruction see below  PATIENT EDUCATION:  Education details: HEP, PT POC/goals Person educated: Patient Education method: Explanation, Demonstration, and Handouts Education comprehension: verbalized understanding and returned demonstration  HOME EXERCISE PROGRAM: Access Code:  [email or web address] URL: [email or web address] Date: 01/22/2024 Prepared by: Vernell  Travelle Mcclimans  Exercises - Sit to Stand Without Arm Support  - 2 x daily - 7 x weekly - 3 sets - 5 reps  ASSESSMENT:  CLINICAL IMPRESSION: Patient was able to demonstrate the ability to amb with SPC in L UE; and is able to amb with more steady gait and no excessive R trunk lean like when he was using it on R side.  He would benefit from more practice this week to reinforce this new motor learning pattern.  He does lack LE proprioception during LE strengthening; use of PT hand for visual target improved technique for knee extension and hamstring curls.  Overall should continue to benefit from skilled PT to address impairments below and to address difficulty with walking due to LE weakness/abnormal balance associated with his condition of multiple sclerosis.   OBJECTIVE IMPAIRMENTS: Abnormal gait, decreased balance, difficulty walking, decreased ROM, decreased strength, and impaired perceived functional ability.   ACTIVITY LIMITATIONS: standing, stairs, and locomotion level  PARTICIPATION LIMITATIONS: cleaning, driving, shopping, community activity, occupation, and yard work  PERSONAL FACTORS: Time since onset of injury/illness/exacerbation are also affecting patient's functional outcome.   REHAB POTENTIAL: Good  CLINICAL DECISION MAKING: Stable/uncomplicated  EVALUATION COMPLEXITY: Low   GOALS: Goals reviewed with patient? Yes  [place] TERM GOALS: Target date: 02/13/24 Pt will be instructed on a HEP for LE strengthening that he can perform while traveling out of the country for vacation early July 2025 Baseline: initiated today Goal status: INITIAL    LONG TERM GOALS: Target date: 03/04/24  Improve ABC >50% indicating improved confidence with balance and reduced fall risk Baseline: 10% Goal status: INITIAL  2.  Pt will amb without dragging R foot on flat surfaces x 6 min walk test using least  restrictive or no AD and no falls Baseline: administer 6 min walk test at next visit; currently amb without sufficient DF at initial contact and reduced R knee flex/hip flex for full R foot clearance Goal status: INITIAL  3.  Improve SLS to >10 seconds R and L to facilitate improved ability to amb on flat surface without any near falls or falls during a 24 hr period of time at home Baseline: unable without use of UE x 10 sec Goal status: INITIAL   PLAN:  PT FREQUENCY: 2x/week  PT DURATION: 6 weeks  PLANNED INTERVENTIONS: 97110-Therapeutic exercises, 97530- Therapeutic activity, 97112- Neuromuscular re-education, 97535- Self Care, and 02859- Manual therapy  PLAN FOR NEXT SESSION: assess balance further, stair navigation, begin LE balance/strengthening/gait training, work on using SPC in L UE; 6 min walk test  Vernell Reges, PT, DPT, OCS   Vernell FORBES Reges, PT 02/29/2024, 9:24 PM

## 2024-03-01 ENCOUNTER — Encounter: Payer: Self-pay | Admitting: Oncology

## 2024-03-04 ENCOUNTER — Ambulatory Visit

## 2024-03-05 ENCOUNTER — Ambulatory Visit

## 2024-03-06 ENCOUNTER — Ambulatory Visit

## 2024-03-07 ENCOUNTER — Ambulatory Visit

## 2024-03-11 ENCOUNTER — Ambulatory Visit

## 2024-03-13 ENCOUNTER — Encounter

## 2024-03-21 ENCOUNTER — Encounter

## 2024-03-25 ENCOUNTER — Encounter

## 2024-03-27 ENCOUNTER — Encounter

## 2024-04-03 ENCOUNTER — Encounter
